# Patient Record
Sex: Male | Born: 1935
Health system: Southern US, Community
[De-identification: ages and names within clinical notes are randomized; demographics above are authoritative.]

## PROBLEM LIST (undated history)

## (undated) DIAGNOSIS — H3530 Unspecified macular degeneration: Secondary | ICD-10-CM

## (undated) DIAGNOSIS — H269 Unspecified cataract: Secondary | ICD-10-CM

## (undated) HISTORY — PX: EYE SURGERY: SHX253

## (undated) HISTORY — DX: Unspecified cataract: H26.9

## (undated) HISTORY — PX: CATARACT EXTRACTION: SUR2

## (undated) HISTORY — DX: Unspecified macular degeneration: H35.30

---

## 2015-09-23 DIAGNOSIS — J069 Acute upper respiratory infection, unspecified: Secondary | ICD-10-CM | POA: Diagnosis not present

## 2015-09-23 DIAGNOSIS — M1990 Unspecified osteoarthritis, unspecified site: Secondary | ICD-10-CM | POA: Diagnosis not present

## 2015-09-23 DIAGNOSIS — Z87891 Personal history of nicotine dependence: Secondary | ICD-10-CM | POA: Diagnosis not present

## 2015-09-23 DIAGNOSIS — I10 Essential (primary) hypertension: Secondary | ICD-10-CM | POA: Diagnosis not present

## 2015-10-14 DIAGNOSIS — Z6824 Body mass index (BMI) 24.0-24.9, adult: Secondary | ICD-10-CM | POA: Diagnosis not present

## 2015-10-14 DIAGNOSIS — Z1211 Encounter for screening for malignant neoplasm of colon: Secondary | ICD-10-CM | POA: Diagnosis not present

## 2015-10-14 DIAGNOSIS — Z1389 Encounter for screening for other disorder: Secondary | ICD-10-CM | POA: Diagnosis not present

## 2015-10-14 DIAGNOSIS — K219 Gastro-esophageal reflux disease without esophagitis: Secondary | ICD-10-CM | POA: Diagnosis not present

## 2015-10-14 DIAGNOSIS — Z299 Encounter for prophylactic measures, unspecified: Secondary | ICD-10-CM | POA: Diagnosis not present

## 2015-10-14 DIAGNOSIS — Z7189 Other specified counseling: Secondary | ICD-10-CM | POA: Diagnosis not present

## 2015-10-14 DIAGNOSIS — Z0000 Encounter for general adult medical examination without abnormal findings: Secondary | ICD-10-CM | POA: Diagnosis not present

## 2015-10-15 DIAGNOSIS — Z125 Encounter for screening for malignant neoplasm of prostate: Secondary | ICD-10-CM | POA: Diagnosis not present

## 2015-10-15 DIAGNOSIS — R5383 Other fatigue: Secondary | ICD-10-CM | POA: Diagnosis not present

## 2015-10-15 DIAGNOSIS — Z79899 Other long term (current) drug therapy: Secondary | ICD-10-CM | POA: Diagnosis not present

## 2016-01-14 DIAGNOSIS — M542 Cervicalgia: Secondary | ICD-10-CM | POA: Diagnosis not present

## 2016-01-14 DIAGNOSIS — M1990 Unspecified osteoarthritis, unspecified site: Secondary | ICD-10-CM | POA: Diagnosis not present

## 2016-01-14 DIAGNOSIS — I10 Essential (primary) hypertension: Secondary | ICD-10-CM | POA: Diagnosis not present

## 2016-01-26 DIAGNOSIS — I10 Essential (primary) hypertension: Secondary | ICD-10-CM | POA: Diagnosis not present

## 2016-01-26 DIAGNOSIS — N400 Enlarged prostate without lower urinary tract symptoms: Secondary | ICD-10-CM | POA: Diagnosis not present

## 2016-01-26 DIAGNOSIS — R1030 Lower abdominal pain, unspecified: Secondary | ICD-10-CM | POA: Diagnosis not present

## 2016-01-27 DIAGNOSIS — R1030 Lower abdominal pain, unspecified: Secondary | ICD-10-CM | POA: Diagnosis not present

## 2016-04-15 DIAGNOSIS — Z299 Encounter for prophylactic measures, unspecified: Secondary | ICD-10-CM | POA: Diagnosis not present

## 2016-04-15 DIAGNOSIS — M545 Low back pain: Secondary | ICD-10-CM | POA: Diagnosis not present

## 2016-04-15 DIAGNOSIS — N400 Enlarged prostate without lower urinary tract symptoms: Secondary | ICD-10-CM | POA: Diagnosis not present

## 2016-04-15 DIAGNOSIS — J069 Acute upper respiratory infection, unspecified: Secondary | ICD-10-CM | POA: Diagnosis not present

## 2016-04-15 DIAGNOSIS — I10 Essential (primary) hypertension: Secondary | ICD-10-CM | POA: Diagnosis not present

## 2016-05-27 DIAGNOSIS — N400 Enlarged prostate without lower urinary tract symptoms: Secondary | ICD-10-CM | POA: Diagnosis not present

## 2016-05-27 DIAGNOSIS — R1030 Lower abdominal pain, unspecified: Secondary | ICD-10-CM | POA: Diagnosis not present

## 2016-05-27 DIAGNOSIS — Z299 Encounter for prophylactic measures, unspecified: Secondary | ICD-10-CM | POA: Diagnosis not present

## 2016-05-27 DIAGNOSIS — I10 Essential (primary) hypertension: Secondary | ICD-10-CM | POA: Diagnosis not present

## 2016-05-27 DIAGNOSIS — N410 Acute prostatitis: Secondary | ICD-10-CM | POA: Diagnosis not present

## 2016-05-31 DIAGNOSIS — R1030 Lower abdominal pain, unspecified: Secondary | ICD-10-CM | POA: Diagnosis not present

## 2016-05-31 DIAGNOSIS — Z79899 Other long term (current) drug therapy: Secondary | ICD-10-CM | POA: Diagnosis not present

## 2016-06-01 DIAGNOSIS — R197 Diarrhea, unspecified: Secondary | ICD-10-CM | POA: Diagnosis not present

## 2016-06-01 DIAGNOSIS — R1032 Left lower quadrant pain: Secondary | ICD-10-CM | POA: Diagnosis not present

## 2016-06-01 DIAGNOSIS — K5900 Constipation, unspecified: Secondary | ICD-10-CM | POA: Diagnosis not present

## 2016-06-01 DIAGNOSIS — R935 Abnormal findings on diagnostic imaging of other abdominal regions, including retroperitoneum: Secondary | ICD-10-CM | POA: Diagnosis not present

## 2016-06-01 DIAGNOSIS — N400 Enlarged prostate without lower urinary tract symptoms: Secondary | ICD-10-CM | POA: Diagnosis not present

## 2016-06-16 DIAGNOSIS — Z299 Encounter for prophylactic measures, unspecified: Secondary | ICD-10-CM | POA: Diagnosis not present

## 2016-06-16 DIAGNOSIS — Z6824 Body mass index (BMI) 24.0-24.9, adult: Secondary | ICD-10-CM | POA: Diagnosis not present

## 2016-06-16 DIAGNOSIS — Z87891 Personal history of nicotine dependence: Secondary | ICD-10-CM | POA: Diagnosis not present

## 2016-06-16 DIAGNOSIS — E785 Hyperlipidemia, unspecified: Secondary | ICD-10-CM | POA: Diagnosis not present

## 2016-06-16 DIAGNOSIS — M1990 Unspecified osteoarthritis, unspecified site: Secondary | ICD-10-CM | POA: Diagnosis not present

## 2016-06-16 DIAGNOSIS — I10 Essential (primary) hypertension: Secondary | ICD-10-CM | POA: Diagnosis not present

## 2016-09-23 DIAGNOSIS — M159 Polyosteoarthritis, unspecified: Secondary | ICD-10-CM | POA: Diagnosis not present

## 2016-09-23 DIAGNOSIS — E7800 Pure hypercholesterolemia, unspecified: Secondary | ICD-10-CM | POA: Diagnosis not present

## 2016-09-23 DIAGNOSIS — I10 Essential (primary) hypertension: Secondary | ICD-10-CM | POA: Diagnosis not present

## 2016-10-19 DIAGNOSIS — R1319 Other dysphagia: Secondary | ICD-10-CM | POA: Diagnosis present

## 2016-10-19 DIAGNOSIS — S066X1A Traumatic subarachnoid hemorrhage with loss of consciousness of 30 minutes or less, initial encounter: Secondary | ICD-10-CM | POA: Diagnosis present

## 2016-10-19 DIAGNOSIS — R58 Hemorrhage, not elsewhere classified: Secondary | ICD-10-CM | POA: Diagnosis not present

## 2016-10-19 DIAGNOSIS — I129 Hypertensive chronic kidney disease with stage 1 through stage 4 chronic kidney disease, or unspecified chronic kidney disease: Secondary | ICD-10-CM | POA: Diagnosis present

## 2016-10-19 DIAGNOSIS — S42002A Fracture of unspecified part of left clavicle, initial encounter for closed fracture: Secondary | ICD-10-CM | POA: Diagnosis not present

## 2016-10-19 DIAGNOSIS — S0219XA Other fracture of base of skull, initial encounter for closed fracture: Secondary | ICD-10-CM | POA: Diagnosis not present

## 2016-10-19 DIAGNOSIS — S065X9A Traumatic subdural hemorrhage with loss of consciousness of unspecified duration, initial encounter: Secondary | ICD-10-CM | POA: Diagnosis not present

## 2016-10-19 DIAGNOSIS — R3914 Feeling of incomplete bladder emptying: Secondary | ICD-10-CM | POA: Diagnosis not present

## 2016-10-19 DIAGNOSIS — S065X9D Traumatic subdural hemorrhage with loss of consciousness of unspecified duration, subsequent encounter: Secondary | ICD-10-CM | POA: Diagnosis not present

## 2016-10-19 DIAGNOSIS — M1611 Unilateral primary osteoarthritis, right hip: Secondary | ICD-10-CM | POA: Diagnosis not present

## 2016-10-19 DIAGNOSIS — G936 Cerebral edema: Secondary | ICD-10-CM | POA: Diagnosis not present

## 2016-10-19 DIAGNOSIS — K219 Gastro-esophageal reflux disease without esophagitis: Secondary | ICD-10-CM | POA: Diagnosis not present

## 2016-10-19 DIAGNOSIS — I6200 Nontraumatic subdural hemorrhage, unspecified: Secondary | ICD-10-CM | POA: Diagnosis not present

## 2016-10-19 DIAGNOSIS — R9089 Other abnormal findings on diagnostic imaging of central nervous system: Secondary | ICD-10-CM | POA: Diagnosis not present

## 2016-10-19 DIAGNOSIS — R000 Tachycardia, unspecified: Secondary | ICD-10-CM | POA: Diagnosis not present

## 2016-10-19 DIAGNOSIS — F0391 Unspecified dementia with behavioral disturbance: Secondary | ICD-10-CM | POA: Diagnosis present

## 2016-10-19 DIAGNOSIS — R410 Disorientation, unspecified: Secondary | ICD-10-CM | POA: Diagnosis not present

## 2016-10-19 DIAGNOSIS — M9931 Osseous stenosis of neural canal of cervical region: Secondary | ICD-10-CM | POA: Diagnosis not present

## 2016-10-19 DIAGNOSIS — G4701 Insomnia due to medical condition: Secondary | ICD-10-CM | POA: Diagnosis not present

## 2016-10-19 DIAGNOSIS — S060X9A Concussion with loss of consciousness of unspecified duration, initial encounter: Secondary | ICD-10-CM | POA: Diagnosis not present

## 2016-10-19 DIAGNOSIS — E876 Hypokalemia: Secondary | ICD-10-CM | POA: Diagnosis not present

## 2016-10-19 DIAGNOSIS — R062 Wheezing: Secondary | ICD-10-CM | POA: Diagnosis not present

## 2016-10-19 DIAGNOSIS — E538 Deficiency of other specified B group vitamins: Secondary | ICD-10-CM | POA: Diagnosis not present

## 2016-10-19 DIAGNOSIS — W19XXXD Unspecified fall, subsequent encounter: Secondary | ICD-10-CM | POA: Diagnosis not present

## 2016-10-19 DIAGNOSIS — Z931 Gastrostomy status: Secondary | ICD-10-CM | POA: Diagnosis not present

## 2016-10-19 DIAGNOSIS — M47812 Spondylosis without myelopathy or radiculopathy, cervical region: Secondary | ICD-10-CM | POA: Diagnosis not present

## 2016-10-19 DIAGNOSIS — S3210XA Unspecified fracture of sacrum, initial encounter for closed fracture: Secondary | ICD-10-CM | POA: Diagnosis not present

## 2016-10-19 DIAGNOSIS — R109 Unspecified abdominal pain: Secondary | ICD-10-CM | POA: Diagnosis not present

## 2016-10-19 DIAGNOSIS — R339 Retention of urine, unspecified: Secondary | ICD-10-CM | POA: Diagnosis not present

## 2016-10-19 DIAGNOSIS — E870 Hyperosmolality and hypernatremia: Secondary | ICD-10-CM | POA: Diagnosis not present

## 2016-10-19 DIAGNOSIS — S02119A Unspecified fracture of occiput, initial encounter for closed fracture: Secondary | ICD-10-CM | POA: Diagnosis not present

## 2016-10-19 DIAGNOSIS — I10 Essential (primary) hypertension: Secondary | ICD-10-CM | POA: Diagnosis not present

## 2016-10-19 DIAGNOSIS — R269 Unspecified abnormalities of gait and mobility: Secondary | ICD-10-CM | POA: Diagnosis not present

## 2016-10-19 DIAGNOSIS — S098XXA Other specified injuries of head, initial encounter: Secondary | ICD-10-CM | POA: Diagnosis not present

## 2016-10-19 DIAGNOSIS — S020XXA Fracture of vault of skull, initial encounter for closed fracture: Secondary | ICD-10-CM | POA: Diagnosis not present

## 2016-10-19 DIAGNOSIS — S161XXA Strain of muscle, fascia and tendon at neck level, initial encounter: Secondary | ICD-10-CM | POA: Diagnosis present

## 2016-10-19 DIAGNOSIS — S22069D Unspecified fracture of T7-T8 vertebra, subsequent encounter for fracture with routine healing: Secondary | ICD-10-CM | POA: Diagnosis not present

## 2016-10-19 DIAGNOSIS — N400 Enlarged prostate without lower urinary tract symptoms: Secondary | ICD-10-CM | POA: Diagnosis not present

## 2016-10-19 DIAGNOSIS — I609 Nontraumatic subarachnoid hemorrhage, unspecified: Secondary | ICD-10-CM | POA: Diagnosis not present

## 2016-10-19 DIAGNOSIS — D62 Acute posthemorrhagic anemia: Secondary | ICD-10-CM | POA: Diagnosis present

## 2016-10-19 DIAGNOSIS — S299XXA Unspecified injury of thorax, initial encounter: Secondary | ICD-10-CM | POA: Diagnosis not present

## 2016-10-19 DIAGNOSIS — S0093XA Contusion of unspecified part of head, initial encounter: Secondary | ICD-10-CM | POA: Diagnosis not present

## 2016-10-19 DIAGNOSIS — S065X0A Traumatic subdural hemorrhage without loss of consciousness, initial encounter: Secondary | ICD-10-CM | POA: Diagnosis not present

## 2016-10-19 DIAGNOSIS — N183 Chronic kidney disease, stage 3 (moderate): Secondary | ICD-10-CM | POA: Diagnosis present

## 2016-10-19 DIAGNOSIS — M7122 Synovial cyst of popliteal space [Baker], left knee: Secondary | ICD-10-CM | POA: Diagnosis not present

## 2016-10-19 DIAGNOSIS — R079 Chest pain, unspecified: Secondary | ICD-10-CM | POA: Diagnosis not present

## 2016-10-19 DIAGNOSIS — S22079A Unspecified fracture of T9-T10 vertebra, initial encounter for closed fracture: Secondary | ICD-10-CM | POA: Diagnosis present

## 2016-10-19 DIAGNOSIS — S0003XA Contusion of scalp, initial encounter: Secondary | ICD-10-CM | POA: Diagnosis not present

## 2016-10-19 DIAGNOSIS — M542 Cervicalgia: Secondary | ICD-10-CM | POA: Diagnosis not present

## 2016-10-19 DIAGNOSIS — S065X1A Traumatic subdural hemorrhage with loss of consciousness of 30 minutes or less, initial encounter: Secondary | ICD-10-CM | POA: Diagnosis present

## 2016-10-19 DIAGNOSIS — W109XXA Fall (on) (from) unspecified stairs and steps, initial encounter: Secondary | ICD-10-CM | POA: Diagnosis not present

## 2016-10-19 DIAGNOSIS — S0291XA Unspecified fracture of skull, initial encounter for closed fracture: Secondary | ICD-10-CM | POA: Diagnosis not present

## 2016-10-19 DIAGNOSIS — I672 Cerebral atherosclerosis: Secondary | ICD-10-CM | POA: Diagnosis not present

## 2016-10-19 DIAGNOSIS — S0093XD Contusion of unspecified part of head, subsequent encounter: Secondary | ICD-10-CM | POA: Diagnosis not present

## 2016-10-19 DIAGNOSIS — Z8673 Personal history of transient ischemic attack (TIA), and cerebral infarction without residual deficits: Secondary | ICD-10-CM | POA: Diagnosis not present

## 2016-10-19 DIAGNOSIS — S32120A Nondisplaced Zone II fracture of sacrum, initial encounter for closed fracture: Secondary | ICD-10-CM | POA: Diagnosis not present

## 2016-10-19 DIAGNOSIS — E559 Vitamin D deficiency, unspecified: Secondary | ICD-10-CM | POA: Diagnosis not present

## 2016-10-19 DIAGNOSIS — S06360A Traumatic hemorrhage of cerebrum, unspecified, without loss of consciousness, initial encounter: Secondary | ICD-10-CM | POA: Diagnosis not present

## 2016-10-19 DIAGNOSIS — J690 Pneumonitis due to inhalation of food and vomit: Secondary | ICD-10-CM | POA: Diagnosis not present

## 2016-10-19 DIAGNOSIS — F05 Delirium due to known physiological condition: Secondary | ICD-10-CM | POA: Diagnosis present

## 2016-10-19 DIAGNOSIS — E871 Hypo-osmolality and hyponatremia: Secondary | ICD-10-CM | POA: Diagnosis present

## 2016-10-19 DIAGNOSIS — S3993XA Unspecified injury of pelvis, initial encounter: Secondary | ICD-10-CM | POA: Diagnosis not present

## 2016-10-19 DIAGNOSIS — R1310 Dysphagia, unspecified: Secondary | ICD-10-CM | POA: Diagnosis not present

## 2016-10-19 DIAGNOSIS — S3992XA Unspecified injury of lower back, initial encounter: Secondary | ICD-10-CM | POA: Diagnosis not present

## 2016-10-19 DIAGNOSIS — S066X0A Traumatic subarachnoid hemorrhage without loss of consciousness, initial encounter: Secondary | ICD-10-CM | POA: Diagnosis not present

## 2016-10-19 DIAGNOSIS — Z2821 Immunization not carried out because of patient refusal: Secondary | ICD-10-CM | POA: Diagnosis not present

## 2016-10-19 DIAGNOSIS — R609 Edema, unspecified: Secondary | ICD-10-CM | POA: Diagnosis not present

## 2016-10-19 DIAGNOSIS — S22068A Other fracture of T7-T8 thoracic vertebra, initial encounter for closed fracture: Secondary | ICD-10-CM | POA: Diagnosis not present

## 2016-10-19 DIAGNOSIS — H9193 Unspecified hearing loss, bilateral: Secondary | ICD-10-CM | POA: Diagnosis present

## 2016-10-19 DIAGNOSIS — Z743 Need for continuous supervision: Secondary | ICD-10-CM | POA: Diagnosis not present

## 2016-10-19 DIAGNOSIS — S199XXA Unspecified injury of neck, initial encounter: Secondary | ICD-10-CM | POA: Diagnosis not present

## 2016-10-19 DIAGNOSIS — S3991XA Unspecified injury of abdomen, initial encounter: Secondary | ICD-10-CM | POA: Diagnosis not present

## 2016-10-19 DIAGNOSIS — W19XXXA Unspecified fall, initial encounter: Secondary | ICD-10-CM | POA: Diagnosis not present

## 2016-10-19 DIAGNOSIS — I2510 Atherosclerotic heart disease of native coronary artery without angina pectoris: Secondary | ICD-10-CM | POA: Diagnosis present

## 2016-10-19 DIAGNOSIS — S060X1A Concussion with loss of consciousness of 30 minutes or less, initial encounter: Secondary | ICD-10-CM | POA: Diagnosis not present

## 2016-10-19 DIAGNOSIS — I959 Hypotension, unspecified: Secondary | ICD-10-CM | POA: Diagnosis not present

## 2016-10-19 DIAGNOSIS — S3219XA Other fracture of sacrum, initial encounter for closed fracture: Secondary | ICD-10-CM | POA: Diagnosis not present

## 2016-10-19 DIAGNOSIS — S22069A Unspecified fracture of T7-T8 vertebra, initial encounter for closed fracture: Secondary | ICD-10-CM | POA: Diagnosis not present

## 2016-10-19 DIAGNOSIS — D649 Anemia, unspecified: Secondary | ICD-10-CM | POA: Diagnosis not present

## 2016-10-19 DIAGNOSIS — S0990XA Unspecified injury of head, initial encounter: Secondary | ICD-10-CM | POA: Diagnosis not present

## 2016-10-19 DIAGNOSIS — Z7189 Other specified counseling: Secondary | ICD-10-CM | POA: Diagnosis not present

## 2016-10-19 DIAGNOSIS — K661 Hemoperitoneum: Secondary | ICD-10-CM | POA: Diagnosis present

## 2016-10-19 DIAGNOSIS — S22078A Other fracture of T9-T10 vertebra, initial encounter for closed fracture: Secondary | ICD-10-CM | POA: Diagnosis not present

## 2016-10-19 DIAGNOSIS — Z9181 History of falling: Secondary | ICD-10-CM | POA: Diagnosis not present

## 2016-10-19 DIAGNOSIS — N401 Enlarged prostate with lower urinary tract symptoms: Secondary | ICD-10-CM | POA: Diagnosis not present

## 2016-10-19 DIAGNOSIS — W108XXA Fall (on) (from) other stairs and steps, initial encounter: Secondary | ICD-10-CM | POA: Diagnosis not present

## 2016-10-19 DIAGNOSIS — S0101XA Laceration without foreign body of scalp, initial encounter: Secondary | ICD-10-CM | POA: Diagnosis present

## 2016-10-19 DIAGNOSIS — R918 Other nonspecific abnormal finding of lung field: Secondary | ICD-10-CM | POA: Diagnosis not present

## 2016-10-19 DIAGNOSIS — S0211HA Other fracture of occiput, left side, initial encounter for closed fracture: Secondary | ICD-10-CM | POA: Diagnosis present

## 2016-10-19 DIAGNOSIS — J984 Other disorders of lung: Secondary | ICD-10-CM | POA: Diagnosis not present

## 2016-11-02 DIAGNOSIS — Z8249 Family history of ischemic heart disease and other diseases of the circulatory system: Secondary | ICD-10-CM | POA: Diagnosis not present

## 2016-11-02 DIAGNOSIS — S066X9D Traumatic subarachnoid hemorrhage with loss of consciousness of unspecified duration, subsequent encounter: Secondary | ICD-10-CM | POA: Diagnosis not present

## 2016-11-02 DIAGNOSIS — S066X0A Traumatic subarachnoid hemorrhage without loss of consciousness, initial encounter: Secondary | ICD-10-CM | POA: Diagnosis not present

## 2016-11-02 DIAGNOSIS — D500 Iron deficiency anemia secondary to blood loss (chronic): Secondary | ICD-10-CM | POA: Diagnosis present

## 2016-11-02 DIAGNOSIS — Z888 Allergy status to other drugs, medicaments and biological substances status: Secondary | ICD-10-CM | POA: Diagnosis not present

## 2016-11-02 DIAGNOSIS — S22069D Unspecified fracture of T7-T8 vertebra, subsequent encounter for fracture with routine healing: Secondary | ICD-10-CM | POA: Diagnosis not present

## 2016-11-02 DIAGNOSIS — S0990XA Unspecified injury of head, initial encounter: Secondary | ICD-10-CM | POA: Diagnosis not present

## 2016-11-02 DIAGNOSIS — S3210XD Unspecified fracture of sacrum, subsequent encounter for fracture with routine healing: Secondary | ICD-10-CM | POA: Diagnosis not present

## 2016-11-02 DIAGNOSIS — G936 Cerebral edema: Secondary | ICD-10-CM | POA: Diagnosis not present

## 2016-11-02 DIAGNOSIS — E7800 Pure hypercholesterolemia, unspecified: Secondary | ICD-10-CM | POA: Diagnosis present

## 2016-11-02 DIAGNOSIS — R0902 Hypoxemia: Secondary | ICD-10-CM | POA: Diagnosis present

## 2016-11-02 DIAGNOSIS — S065X0A Traumatic subdural hemorrhage without loss of consciousness, initial encounter: Secondary | ICD-10-CM | POA: Diagnosis not present

## 2016-11-02 DIAGNOSIS — S0291XA Unspecified fracture of skull, initial encounter for closed fracture: Secondary | ICD-10-CM | POA: Diagnosis not present

## 2016-11-02 DIAGNOSIS — J690 Pneumonitis due to inhalation of food and vomit: Secondary | ICD-10-CM | POA: Diagnosis present

## 2016-11-02 DIAGNOSIS — R0602 Shortness of breath: Secondary | ICD-10-CM | POA: Diagnosis not present

## 2016-11-02 DIAGNOSIS — S065X9D Traumatic subdural hemorrhage with loss of consciousness of unspecified duration, subsequent encounter: Secondary | ICD-10-CM | POA: Diagnosis not present

## 2016-11-02 DIAGNOSIS — R404 Transient alteration of awareness: Secondary | ICD-10-CM | POA: Diagnosis not present

## 2016-11-02 DIAGNOSIS — N400 Enlarged prostate without lower urinary tract symptoms: Secondary | ICD-10-CM | POA: Diagnosis present

## 2016-11-02 DIAGNOSIS — S061X9D Traumatic cerebral edema with loss of consciousness of unspecified duration, subsequent encounter: Secondary | ICD-10-CM | POA: Diagnosis not present

## 2016-11-02 DIAGNOSIS — R0603 Acute respiratory distress: Secondary | ICD-10-CM | POA: Diagnosis present

## 2016-11-02 DIAGNOSIS — S22079D Unspecified fracture of T9-T10 vertebra, subsequent encounter for fracture with routine healing: Secondary | ICD-10-CM | POA: Diagnosis not present

## 2016-11-02 DIAGNOSIS — R0682 Tachypnea, not elsewhere classified: Secondary | ICD-10-CM | POA: Diagnosis not present

## 2016-11-02 DIAGNOSIS — Z87891 Personal history of nicotine dependence: Secondary | ICD-10-CM | POA: Diagnosis not present

## 2016-11-02 DIAGNOSIS — K219 Gastro-esophageal reflux disease without esophagitis: Secondary | ICD-10-CM | POA: Diagnosis present

## 2016-11-02 DIAGNOSIS — W19XXXD Unspecified fall, subsequent encounter: Secondary | ICD-10-CM | POA: Diagnosis not present

## 2016-11-02 DIAGNOSIS — Z79899 Other long term (current) drug therapy: Secondary | ICD-10-CM | POA: Diagnosis not present

## 2016-11-02 DIAGNOSIS — D649 Anemia, unspecified: Secondary | ICD-10-CM | POA: Diagnosis not present

## 2016-11-02 DIAGNOSIS — Z931 Gastrostomy status: Secondary | ICD-10-CM | POA: Diagnosis not present

## 2016-11-02 DIAGNOSIS — S3210XA Unspecified fracture of sacrum, initial encounter for closed fracture: Secondary | ICD-10-CM | POA: Diagnosis not present

## 2016-11-02 DIAGNOSIS — I10 Essential (primary) hypertension: Secondary | ICD-10-CM | POA: Diagnosis present

## 2016-11-02 DIAGNOSIS — S065X9A Traumatic subdural hemorrhage with loss of consciousness of unspecified duration, initial encounter: Secondary | ICD-10-CM | POA: Diagnosis not present

## 2016-11-02 DIAGNOSIS — Z9181 History of falling: Secondary | ICD-10-CM | POA: Diagnosis not present

## 2016-11-02 DIAGNOSIS — Z886 Allergy status to analgesic agent status: Secondary | ICD-10-CM | POA: Diagnosis not present

## 2016-11-03 DIAGNOSIS — Z8249 Family history of ischemic heart disease and other diseases of the circulatory system: Secondary | ICD-10-CM | POA: Diagnosis not present

## 2016-11-03 DIAGNOSIS — R0603 Acute respiratory distress: Secondary | ICD-10-CM | POA: Diagnosis not present

## 2016-11-03 DIAGNOSIS — S3210XA Unspecified fracture of sacrum, initial encounter for closed fracture: Secondary | ICD-10-CM | POA: Diagnosis not present

## 2016-11-03 DIAGNOSIS — E7800 Pure hypercholesterolemia, unspecified: Secondary | ICD-10-CM | POA: Diagnosis present

## 2016-11-03 DIAGNOSIS — R0602 Shortness of breath: Secondary | ICD-10-CM | POA: Diagnosis not present

## 2016-11-03 DIAGNOSIS — K219 Gastro-esophageal reflux disease without esophagitis: Secondary | ICD-10-CM | POA: Diagnosis present

## 2016-11-03 DIAGNOSIS — S066X0A Traumatic subarachnoid hemorrhage without loss of consciousness, initial encounter: Secondary | ICD-10-CM | POA: Diagnosis not present

## 2016-11-03 DIAGNOSIS — R0682 Tachypnea, not elsewhere classified: Secondary | ICD-10-CM | POA: Diagnosis not present

## 2016-11-03 DIAGNOSIS — S22079D Unspecified fracture of T9-T10 vertebra, subsequent encounter for fracture with routine healing: Secondary | ICD-10-CM | POA: Diagnosis not present

## 2016-11-03 DIAGNOSIS — J90 Pleural effusion, not elsewhere classified: Secondary | ICD-10-CM | POA: Diagnosis not present

## 2016-11-03 DIAGNOSIS — Z79899 Other long term (current) drug therapy: Secondary | ICD-10-CM | POA: Diagnosis not present

## 2016-11-03 DIAGNOSIS — S3210XD Unspecified fracture of sacrum, subsequent encounter for fracture with routine healing: Secondary | ICD-10-CM | POA: Diagnosis not present

## 2016-11-03 DIAGNOSIS — R2689 Other abnormalities of gait and mobility: Secondary | ICD-10-CM | POA: Diagnosis not present

## 2016-11-03 DIAGNOSIS — N400 Enlarged prostate without lower urinary tract symptoms: Secondary | ICD-10-CM | POA: Diagnosis present

## 2016-11-03 DIAGNOSIS — Z9181 History of falling: Secondary | ICD-10-CM | POA: Diagnosis not present

## 2016-11-03 DIAGNOSIS — I10 Essential (primary) hypertension: Secondary | ICD-10-CM | POA: Diagnosis present

## 2016-11-03 DIAGNOSIS — W19XXXD Unspecified fall, subsequent encounter: Secondary | ICD-10-CM | POA: Diagnosis not present

## 2016-11-03 DIAGNOSIS — D649 Anemia, unspecified: Secondary | ICD-10-CM | POA: Diagnosis not present

## 2016-11-03 DIAGNOSIS — S22009D Unspecified fracture of unspecified thoracic vertebra, subsequent encounter for fracture with routine healing: Secondary | ICD-10-CM | POA: Diagnosis not present

## 2016-11-03 DIAGNOSIS — G936 Cerebral edema: Secondary | ICD-10-CM | POA: Diagnosis not present

## 2016-11-03 DIAGNOSIS — S066X9D Traumatic subarachnoid hemorrhage with loss of consciousness of unspecified duration, subsequent encounter: Secondary | ICD-10-CM | POA: Diagnosis not present

## 2016-11-03 DIAGNOSIS — R0902 Hypoxemia: Secondary | ICD-10-CM | POA: Diagnosis present

## 2016-11-03 DIAGNOSIS — S065X9D Traumatic subdural hemorrhage with loss of consciousness of unspecified duration, subsequent encounter: Secondary | ICD-10-CM | POA: Diagnosis not present

## 2016-11-03 DIAGNOSIS — Z87891 Personal history of nicotine dependence: Secondary | ICD-10-CM | POA: Diagnosis not present

## 2016-11-03 DIAGNOSIS — S22069D Unspecified fracture of T7-T8 vertebra, subsequent encounter for fracture with routine healing: Secondary | ICD-10-CM | POA: Diagnosis not present

## 2016-11-03 DIAGNOSIS — Z931 Gastrostomy status: Secondary | ICD-10-CM | POA: Diagnosis not present

## 2016-11-03 DIAGNOSIS — Z888 Allergy status to other drugs, medicaments and biological substances status: Secondary | ICD-10-CM | POA: Diagnosis not present

## 2016-11-03 DIAGNOSIS — R404 Transient alteration of awareness: Secondary | ICD-10-CM | POA: Diagnosis not present

## 2016-11-03 DIAGNOSIS — S065X0A Traumatic subdural hemorrhage without loss of consciousness, initial encounter: Secondary | ICD-10-CM | POA: Diagnosis not present

## 2016-11-03 DIAGNOSIS — M6281 Muscle weakness (generalized): Secondary | ICD-10-CM | POA: Diagnosis not present

## 2016-11-03 DIAGNOSIS — D500 Iron deficiency anemia secondary to blood loss (chronic): Secondary | ICD-10-CM | POA: Diagnosis not present

## 2016-11-03 DIAGNOSIS — S0990XD Unspecified injury of head, subsequent encounter: Secondary | ICD-10-CM | POA: Diagnosis not present

## 2016-11-03 DIAGNOSIS — Z886 Allergy status to analgesic agent status: Secondary | ICD-10-CM | POA: Diagnosis not present

## 2016-11-03 DIAGNOSIS — S061X9D Traumatic cerebral edema with loss of consciousness of unspecified duration, subsequent encounter: Secondary | ICD-10-CM | POA: Diagnosis not present

## 2016-11-03 DIAGNOSIS — J302 Other seasonal allergic rhinitis: Secondary | ICD-10-CM | POA: Diagnosis not present

## 2016-11-03 DIAGNOSIS — J690 Pneumonitis due to inhalation of food and vomit: Secondary | ICD-10-CM | POA: Diagnosis not present

## 2016-11-05 DIAGNOSIS — R0902 Hypoxemia: Secondary | ICD-10-CM | POA: Diagnosis not present

## 2016-11-05 DIAGNOSIS — S065X9D Traumatic subdural hemorrhage with loss of consciousness of unspecified duration, subsequent encounter: Secondary | ICD-10-CM | POA: Diagnosis not present

## 2016-11-05 DIAGNOSIS — R1312 Dysphagia, oropharyngeal phase: Secondary | ICD-10-CM | POA: Diagnosis not present

## 2016-11-05 DIAGNOSIS — Z9181 History of falling: Secondary | ICD-10-CM | POA: Diagnosis not present

## 2016-11-05 DIAGNOSIS — R1310 Dysphagia, unspecified: Secondary | ICD-10-CM | POA: Diagnosis not present

## 2016-11-05 DIAGNOSIS — J690 Pneumonitis due to inhalation of food and vomit: Secondary | ICD-10-CM | POA: Diagnosis not present

## 2016-11-05 DIAGNOSIS — M6281 Muscle weakness (generalized): Secondary | ICD-10-CM | POA: Diagnosis not present

## 2016-11-05 DIAGNOSIS — M4854XA Collapsed vertebra, not elsewhere classified, thoracic region, initial encounter for fracture: Secondary | ICD-10-CM | POA: Diagnosis not present

## 2016-11-05 DIAGNOSIS — S0990XD Unspecified injury of head, subsequent encounter: Secondary | ICD-10-CM | POA: Diagnosis not present

## 2016-11-05 DIAGNOSIS — S134XXA Sprain of ligaments of cervical spine, initial encounter: Secondary | ICD-10-CM | POA: Diagnosis not present

## 2016-11-05 DIAGNOSIS — N400 Enlarged prostate without lower urinary tract symptoms: Secondary | ICD-10-CM | POA: Diagnosis not present

## 2016-11-05 DIAGNOSIS — S139XXA Sprain of joints and ligaments of unspecified parts of neck, initial encounter: Secondary | ICD-10-CM | POA: Diagnosis not present

## 2016-11-05 DIAGNOSIS — Z888 Allergy status to other drugs, medicaments and biological substances status: Secondary | ICD-10-CM | POA: Diagnosis not present

## 2016-11-05 DIAGNOSIS — D649 Anemia, unspecified: Secondary | ICD-10-CM | POA: Diagnosis not present

## 2016-11-05 DIAGNOSIS — I10 Essential (primary) hypertension: Secondary | ICD-10-CM | POA: Diagnosis not present

## 2016-11-05 DIAGNOSIS — S065X0A Traumatic subdural hemorrhage without loss of consciousness, initial encounter: Secondary | ICD-10-CM | POA: Diagnosis not present

## 2016-11-05 DIAGNOSIS — J90 Pleural effusion, not elsewhere classified: Secondary | ICD-10-CM | POA: Diagnosis not present

## 2016-11-05 DIAGNOSIS — T1490XA Injury, unspecified, initial encounter: Secondary | ICD-10-CM | POA: Diagnosis not present

## 2016-11-05 DIAGNOSIS — K219 Gastro-esophageal reflux disease without esophagitis: Secondary | ICD-10-CM | POA: Diagnosis not present

## 2016-11-05 DIAGNOSIS — S129XXA Fracture of neck, unspecified, initial encounter: Secondary | ICD-10-CM | POA: Diagnosis not present

## 2016-11-05 DIAGNOSIS — R0603 Acute respiratory distress: Secondary | ICD-10-CM | POA: Diagnosis not present

## 2016-11-05 DIAGNOSIS — J302 Other seasonal allergic rhinitis: Secondary | ICD-10-CM | POA: Diagnosis not present

## 2016-11-05 DIAGNOSIS — J9601 Acute respiratory failure with hypoxia: Secondary | ICD-10-CM | POA: Diagnosis not present

## 2016-11-05 DIAGNOSIS — W19XXXD Unspecified fall, subsequent encounter: Secondary | ICD-10-CM | POA: Diagnosis not present

## 2016-11-05 DIAGNOSIS — S22009D Unspecified fracture of unspecified thoracic vertebra, subsequent encounter for fracture with routine healing: Secondary | ICD-10-CM | POA: Diagnosis not present

## 2016-11-05 DIAGNOSIS — Z886 Allergy status to analgesic agent status: Secondary | ICD-10-CM | POA: Diagnosis not present

## 2016-11-05 DIAGNOSIS — S199XXA Unspecified injury of neck, initial encounter: Secondary | ICD-10-CM | POA: Diagnosis not present

## 2016-11-05 DIAGNOSIS — M1990 Unspecified osteoarthritis, unspecified site: Secondary | ICD-10-CM | POA: Diagnosis not present

## 2016-11-05 DIAGNOSIS — T17200A Unspecified foreign body in pharynx causing asphyxiation, initial encounter: Secondary | ICD-10-CM | POA: Diagnosis not present

## 2016-11-05 DIAGNOSIS — S066X9D Traumatic subarachnoid hemorrhage with loss of consciousness of unspecified duration, subsequent encounter: Secondary | ICD-10-CM | POA: Diagnosis not present

## 2016-11-05 DIAGNOSIS — M47812 Spondylosis without myelopathy or radiculopathy, cervical region: Secondary | ICD-10-CM | POA: Diagnosis not present

## 2016-11-05 DIAGNOSIS — R2689 Other abnormalities of gait and mobility: Secondary | ICD-10-CM | POA: Diagnosis not present

## 2016-11-05 DIAGNOSIS — S0990XA Unspecified injury of head, initial encounter: Secondary | ICD-10-CM | POA: Diagnosis not present

## 2016-11-05 DIAGNOSIS — Z431 Encounter for attention to gastrostomy: Secondary | ICD-10-CM | POA: Diagnosis not present

## 2016-11-06 DIAGNOSIS — S0990XA Unspecified injury of head, initial encounter: Secondary | ICD-10-CM | POA: Diagnosis not present

## 2016-11-06 DIAGNOSIS — J690 Pneumonitis due to inhalation of food and vomit: Secondary | ICD-10-CM | POA: Diagnosis not present

## 2016-11-06 DIAGNOSIS — S065X0A Traumatic subdural hemorrhage without loss of consciousness, initial encounter: Secondary | ICD-10-CM | POA: Diagnosis not present

## 2016-11-06 DIAGNOSIS — J9601 Acute respiratory failure with hypoxia: Secondary | ICD-10-CM | POA: Diagnosis not present

## 2016-11-27 DIAGNOSIS — R1310 Dysphagia, unspecified: Secondary | ICD-10-CM | POA: Diagnosis not present

## 2016-11-27 DIAGNOSIS — S199XXA Unspecified injury of neck, initial encounter: Secondary | ICD-10-CM | POA: Diagnosis not present

## 2016-11-27 DIAGNOSIS — Z9181 History of falling: Secondary | ICD-10-CM | POA: Diagnosis not present

## 2016-11-27 DIAGNOSIS — J690 Pneumonitis due to inhalation of food and vomit: Secondary | ICD-10-CM | POA: Diagnosis not present

## 2016-11-29 DIAGNOSIS — R1312 Dysphagia, oropharyngeal phase: Secondary | ICD-10-CM | POA: Diagnosis not present

## 2016-12-17 DIAGNOSIS — S134XXA Sprain of ligaments of cervical spine, initial encounter: Secondary | ICD-10-CM | POA: Diagnosis not present

## 2016-12-17 DIAGNOSIS — M4854XA Collapsed vertebra, not elsewhere classified, thoracic region, initial encounter for fracture: Secondary | ICD-10-CM | POA: Diagnosis not present

## 2016-12-17 DIAGNOSIS — M47812 Spondylosis without myelopathy or radiculopathy, cervical region: Secondary | ICD-10-CM | POA: Diagnosis not present

## 2016-12-17 DIAGNOSIS — S139XXA Sprain of joints and ligaments of unspecified parts of neck, initial encounter: Secondary | ICD-10-CM | POA: Diagnosis not present

## 2016-12-21 DIAGNOSIS — S129XXA Fracture of neck, unspecified, initial encounter: Secondary | ICD-10-CM | POA: Diagnosis not present

## 2016-12-21 DIAGNOSIS — Z431 Encounter for attention to gastrostomy: Secondary | ICD-10-CM | POA: Diagnosis not present

## 2016-12-21 DIAGNOSIS — T1490XA Injury, unspecified, initial encounter: Secondary | ICD-10-CM | POA: Diagnosis not present

## 2016-12-24 DIAGNOSIS — R1312 Dysphagia, oropharyngeal phase: Secondary | ICD-10-CM | POA: Diagnosis not present

## 2017-01-17 DIAGNOSIS — S129XXA Fracture of neck, unspecified, initial encounter: Secondary | ICD-10-CM | POA: Diagnosis not present

## 2017-01-17 DIAGNOSIS — M1990 Unspecified osteoarthritis, unspecified site: Secondary | ICD-10-CM | POA: Diagnosis not present

## 2017-01-17 DIAGNOSIS — R0902 Hypoxemia: Secondary | ICD-10-CM | POA: Diagnosis not present

## 2017-01-17 DIAGNOSIS — T17200A Unspecified foreign body in pharynx causing asphyxiation, initial encounter: Secondary | ICD-10-CM | POA: Diagnosis not present

## 2017-01-18 DIAGNOSIS — Z9181 History of falling: Secondary | ICD-10-CM | POA: Diagnosis not present

## 2017-01-18 DIAGNOSIS — S22060D Wedge compression fracture of T7-T8 vertebra, subsequent encounter for fracture with routine healing: Secondary | ICD-10-CM | POA: Diagnosis not present

## 2017-01-18 DIAGNOSIS — N400 Enlarged prostate without lower urinary tract symptoms: Secondary | ICD-10-CM | POA: Diagnosis not present

## 2017-01-18 DIAGNOSIS — R1310 Dysphagia, unspecified: Secondary | ICD-10-CM | POA: Diagnosis not present

## 2017-01-18 DIAGNOSIS — J690 Pneumonitis due to inhalation of food and vomit: Secondary | ICD-10-CM | POA: Diagnosis not present

## 2017-01-18 DIAGNOSIS — S129XXD Fracture of neck, unspecified, subsequent encounter: Secondary | ICD-10-CM | POA: Diagnosis not present

## 2017-01-18 DIAGNOSIS — S066X9D Traumatic subarachnoid hemorrhage with loss of consciousness of unspecified duration, subsequent encounter: Secondary | ICD-10-CM | POA: Diagnosis not present

## 2017-01-18 DIAGNOSIS — S065X9D Traumatic subdural hemorrhage with loss of consciousness of unspecified duration, subsequent encounter: Secondary | ICD-10-CM | POA: Diagnosis not present

## 2017-01-18 DIAGNOSIS — Z931 Gastrostomy status: Secondary | ICD-10-CM | POA: Diagnosis not present

## 2017-01-18 DIAGNOSIS — K219 Gastro-esophageal reflux disease without esophagitis: Secondary | ICD-10-CM | POA: Diagnosis not present

## 2017-01-18 DIAGNOSIS — S3210XD Unspecified fracture of sacrum, subsequent encounter for fracture with routine healing: Secondary | ICD-10-CM | POA: Diagnosis not present

## 2017-01-18 DIAGNOSIS — S22070D Wedge compression fracture of T9-T10 vertebra, subsequent encounter for fracture with routine healing: Secondary | ICD-10-CM | POA: Diagnosis not present

## 2017-01-18 DIAGNOSIS — I10 Essential (primary) hypertension: Secondary | ICD-10-CM | POA: Diagnosis not present

## 2017-01-19 DIAGNOSIS — S129XXD Fracture of neck, unspecified, subsequent encounter: Secondary | ICD-10-CM | POA: Diagnosis not present

## 2017-01-19 DIAGNOSIS — S3210XD Unspecified fracture of sacrum, subsequent encounter for fracture with routine healing: Secondary | ICD-10-CM | POA: Diagnosis not present

## 2017-01-19 DIAGNOSIS — R1310 Dysphagia, unspecified: Secondary | ICD-10-CM | POA: Diagnosis not present

## 2017-01-19 DIAGNOSIS — J690 Pneumonitis due to inhalation of food and vomit: Secondary | ICD-10-CM | POA: Diagnosis not present

## 2017-01-19 DIAGNOSIS — S22070D Wedge compression fracture of T9-T10 vertebra, subsequent encounter for fracture with routine healing: Secondary | ICD-10-CM | POA: Diagnosis not present

## 2017-01-19 DIAGNOSIS — S22060D Wedge compression fracture of T7-T8 vertebra, subsequent encounter for fracture with routine healing: Secondary | ICD-10-CM | POA: Diagnosis not present

## 2017-01-20 DIAGNOSIS — R1310 Dysphagia, unspecified: Secondary | ICD-10-CM | POA: Diagnosis not present

## 2017-01-20 DIAGNOSIS — S22070D Wedge compression fracture of T9-T10 vertebra, subsequent encounter for fracture with routine healing: Secondary | ICD-10-CM | POA: Diagnosis not present

## 2017-01-20 DIAGNOSIS — J690 Pneumonitis due to inhalation of food and vomit: Secondary | ICD-10-CM | POA: Diagnosis not present

## 2017-01-20 DIAGNOSIS — S22060D Wedge compression fracture of T7-T8 vertebra, subsequent encounter for fracture with routine healing: Secondary | ICD-10-CM | POA: Diagnosis not present

## 2017-01-20 DIAGNOSIS — S129XXD Fracture of neck, unspecified, subsequent encounter: Secondary | ICD-10-CM | POA: Diagnosis not present

## 2017-01-20 DIAGNOSIS — S3210XD Unspecified fracture of sacrum, subsequent encounter for fracture with routine healing: Secondary | ICD-10-CM | POA: Diagnosis not present

## 2017-01-21 DIAGNOSIS — S22060D Wedge compression fracture of T7-T8 vertebra, subsequent encounter for fracture with routine healing: Secondary | ICD-10-CM | POA: Diagnosis not present

## 2017-01-21 DIAGNOSIS — S129XXD Fracture of neck, unspecified, subsequent encounter: Secondary | ICD-10-CM | POA: Diagnosis not present

## 2017-01-21 DIAGNOSIS — S3210XD Unspecified fracture of sacrum, subsequent encounter for fracture with routine healing: Secondary | ICD-10-CM | POA: Diagnosis not present

## 2017-01-21 DIAGNOSIS — J690 Pneumonitis due to inhalation of food and vomit: Secondary | ICD-10-CM | POA: Diagnosis not present

## 2017-01-21 DIAGNOSIS — R1310 Dysphagia, unspecified: Secondary | ICD-10-CM | POA: Diagnosis not present

## 2017-01-21 DIAGNOSIS — S22070D Wedge compression fracture of T9-T10 vertebra, subsequent encounter for fracture with routine healing: Secondary | ICD-10-CM | POA: Diagnosis not present

## 2017-01-24 DIAGNOSIS — S129XXD Fracture of neck, unspecified, subsequent encounter: Secondary | ICD-10-CM | POA: Diagnosis not present

## 2017-01-24 DIAGNOSIS — S22070D Wedge compression fracture of T9-T10 vertebra, subsequent encounter for fracture with routine healing: Secondary | ICD-10-CM | POA: Diagnosis not present

## 2017-01-24 DIAGNOSIS — S3210XD Unspecified fracture of sacrum, subsequent encounter for fracture with routine healing: Secondary | ICD-10-CM | POA: Diagnosis not present

## 2017-01-24 DIAGNOSIS — S22060D Wedge compression fracture of T7-T8 vertebra, subsequent encounter for fracture with routine healing: Secondary | ICD-10-CM | POA: Diagnosis not present

## 2017-01-24 DIAGNOSIS — J690 Pneumonitis due to inhalation of food and vomit: Secondary | ICD-10-CM | POA: Diagnosis not present

## 2017-01-24 DIAGNOSIS — R1310 Dysphagia, unspecified: Secondary | ICD-10-CM | POA: Diagnosis not present

## 2017-01-25 DIAGNOSIS — R1310 Dysphagia, unspecified: Secondary | ICD-10-CM | POA: Diagnosis not present

## 2017-01-25 DIAGNOSIS — S3210XD Unspecified fracture of sacrum, subsequent encounter for fracture with routine healing: Secondary | ICD-10-CM | POA: Diagnosis not present

## 2017-01-25 DIAGNOSIS — J690 Pneumonitis due to inhalation of food and vomit: Secondary | ICD-10-CM | POA: Diagnosis not present

## 2017-01-25 DIAGNOSIS — K5900 Constipation, unspecified: Secondary | ICD-10-CM | POA: Diagnosis not present

## 2017-01-25 DIAGNOSIS — S22070D Wedge compression fracture of T9-T10 vertebra, subsequent encounter for fracture with routine healing: Secondary | ICD-10-CM | POA: Diagnosis not present

## 2017-01-25 DIAGNOSIS — Z6822 Body mass index (BMI) 22.0-22.9, adult: Secondary | ICD-10-CM | POA: Diagnosis not present

## 2017-01-25 DIAGNOSIS — E876 Hypokalemia: Secondary | ICD-10-CM | POA: Diagnosis not present

## 2017-01-25 DIAGNOSIS — S129XXD Fracture of neck, unspecified, subsequent encounter: Secondary | ICD-10-CM | POA: Diagnosis not present

## 2017-01-25 DIAGNOSIS — S129XXA Fracture of neck, unspecified, initial encounter: Secondary | ICD-10-CM | POA: Diagnosis not present

## 2017-01-25 DIAGNOSIS — E785 Hyperlipidemia, unspecified: Secondary | ICD-10-CM | POA: Diagnosis not present

## 2017-01-25 DIAGNOSIS — D649 Anemia, unspecified: Secondary | ICD-10-CM | POA: Diagnosis not present

## 2017-01-25 DIAGNOSIS — S22060D Wedge compression fracture of T7-T8 vertebra, subsequent encounter for fracture with routine healing: Secondary | ICD-10-CM | POA: Diagnosis not present

## 2017-01-25 DIAGNOSIS — I10 Essential (primary) hypertension: Secondary | ICD-10-CM | POA: Diagnosis not present

## 2017-01-25 DIAGNOSIS — N400 Enlarged prostate without lower urinary tract symptoms: Secondary | ICD-10-CM | POA: Diagnosis not present

## 2017-01-25 DIAGNOSIS — Z299 Encounter for prophylactic measures, unspecified: Secondary | ICD-10-CM | POA: Diagnosis not present

## 2017-01-25 DIAGNOSIS — Z09 Encounter for follow-up examination after completed treatment for conditions other than malignant neoplasm: Secondary | ICD-10-CM | POA: Diagnosis not present

## 2017-01-26 DIAGNOSIS — J690 Pneumonitis due to inhalation of food and vomit: Secondary | ICD-10-CM | POA: Diagnosis not present

## 2017-01-26 DIAGNOSIS — S3210XD Unspecified fracture of sacrum, subsequent encounter for fracture with routine healing: Secondary | ICD-10-CM | POA: Diagnosis not present

## 2017-01-26 DIAGNOSIS — S22070D Wedge compression fracture of T9-T10 vertebra, subsequent encounter for fracture with routine healing: Secondary | ICD-10-CM | POA: Diagnosis not present

## 2017-01-26 DIAGNOSIS — S22060D Wedge compression fracture of T7-T8 vertebra, subsequent encounter for fracture with routine healing: Secondary | ICD-10-CM | POA: Diagnosis not present

## 2017-01-26 DIAGNOSIS — R1310 Dysphagia, unspecified: Secondary | ICD-10-CM | POA: Diagnosis not present

## 2017-01-26 DIAGNOSIS — S129XXD Fracture of neck, unspecified, subsequent encounter: Secondary | ICD-10-CM | POA: Diagnosis not present

## 2017-01-27 DIAGNOSIS — R1310 Dysphagia, unspecified: Secondary | ICD-10-CM | POA: Diagnosis not present

## 2017-01-27 DIAGNOSIS — S3210XD Unspecified fracture of sacrum, subsequent encounter for fracture with routine healing: Secondary | ICD-10-CM | POA: Diagnosis not present

## 2017-01-27 DIAGNOSIS — S129XXD Fracture of neck, unspecified, subsequent encounter: Secondary | ICD-10-CM | POA: Diagnosis not present

## 2017-01-27 DIAGNOSIS — S22060D Wedge compression fracture of T7-T8 vertebra, subsequent encounter for fracture with routine healing: Secondary | ICD-10-CM | POA: Diagnosis not present

## 2017-01-27 DIAGNOSIS — J690 Pneumonitis due to inhalation of food and vomit: Secondary | ICD-10-CM | POA: Diagnosis not present

## 2017-01-27 DIAGNOSIS — S22070D Wedge compression fracture of T9-T10 vertebra, subsequent encounter for fracture with routine healing: Secondary | ICD-10-CM | POA: Diagnosis not present

## 2017-01-28 DIAGNOSIS — S3210XD Unspecified fracture of sacrum, subsequent encounter for fracture with routine healing: Secondary | ICD-10-CM | POA: Diagnosis not present

## 2017-01-28 DIAGNOSIS — S22060D Wedge compression fracture of T7-T8 vertebra, subsequent encounter for fracture with routine healing: Secondary | ICD-10-CM | POA: Diagnosis not present

## 2017-01-28 DIAGNOSIS — J690 Pneumonitis due to inhalation of food and vomit: Secondary | ICD-10-CM | POA: Diagnosis not present

## 2017-01-28 DIAGNOSIS — R1310 Dysphagia, unspecified: Secondary | ICD-10-CM | POA: Diagnosis not present

## 2017-01-28 DIAGNOSIS — S22070D Wedge compression fracture of T9-T10 vertebra, subsequent encounter for fracture with routine healing: Secondary | ICD-10-CM | POA: Diagnosis not present

## 2017-01-28 DIAGNOSIS — S129XXD Fracture of neck, unspecified, subsequent encounter: Secondary | ICD-10-CM | POA: Diagnosis not present

## 2017-01-31 DIAGNOSIS — S3210XD Unspecified fracture of sacrum, subsequent encounter for fracture with routine healing: Secondary | ICD-10-CM | POA: Diagnosis not present

## 2017-01-31 DIAGNOSIS — S22060D Wedge compression fracture of T7-T8 vertebra, subsequent encounter for fracture with routine healing: Secondary | ICD-10-CM | POA: Diagnosis not present

## 2017-01-31 DIAGNOSIS — J690 Pneumonitis due to inhalation of food and vomit: Secondary | ICD-10-CM | POA: Diagnosis not present

## 2017-01-31 DIAGNOSIS — S129XXD Fracture of neck, unspecified, subsequent encounter: Secondary | ICD-10-CM | POA: Diagnosis not present

## 2017-01-31 DIAGNOSIS — R1310 Dysphagia, unspecified: Secondary | ICD-10-CM | POA: Diagnosis not present

## 2017-01-31 DIAGNOSIS — S22070D Wedge compression fracture of T9-T10 vertebra, subsequent encounter for fracture with routine healing: Secondary | ICD-10-CM | POA: Diagnosis not present

## 2017-02-01 DIAGNOSIS — S129XXD Fracture of neck, unspecified, subsequent encounter: Secondary | ICD-10-CM | POA: Diagnosis not present

## 2017-02-01 DIAGNOSIS — S3210XD Unspecified fracture of sacrum, subsequent encounter for fracture with routine healing: Secondary | ICD-10-CM | POA: Diagnosis not present

## 2017-02-01 DIAGNOSIS — J690 Pneumonitis due to inhalation of food and vomit: Secondary | ICD-10-CM | POA: Diagnosis not present

## 2017-02-01 DIAGNOSIS — R1310 Dysphagia, unspecified: Secondary | ICD-10-CM | POA: Diagnosis not present

## 2017-02-01 DIAGNOSIS — S22070D Wedge compression fracture of T9-T10 vertebra, subsequent encounter for fracture with routine healing: Secondary | ICD-10-CM | POA: Diagnosis not present

## 2017-02-01 DIAGNOSIS — S22060D Wedge compression fracture of T7-T8 vertebra, subsequent encounter for fracture with routine healing: Secondary | ICD-10-CM | POA: Diagnosis not present

## 2017-02-02 DIAGNOSIS — S22070D Wedge compression fracture of T9-T10 vertebra, subsequent encounter for fracture with routine healing: Secondary | ICD-10-CM | POA: Diagnosis not present

## 2017-02-02 DIAGNOSIS — S22060D Wedge compression fracture of T7-T8 vertebra, subsequent encounter for fracture with routine healing: Secondary | ICD-10-CM | POA: Diagnosis not present

## 2017-02-02 DIAGNOSIS — I10 Essential (primary) hypertension: Secondary | ICD-10-CM | POA: Diagnosis not present

## 2017-02-02 DIAGNOSIS — S3210XD Unspecified fracture of sacrum, subsequent encounter for fracture with routine healing: Secondary | ICD-10-CM | POA: Diagnosis not present

## 2017-02-02 DIAGNOSIS — J690 Pneumonitis due to inhalation of food and vomit: Secondary | ICD-10-CM | POA: Diagnosis not present

## 2017-02-02 DIAGNOSIS — S129XXD Fracture of neck, unspecified, subsequent encounter: Secondary | ICD-10-CM | POA: Diagnosis not present

## 2017-02-02 DIAGNOSIS — R1310 Dysphagia, unspecified: Secondary | ICD-10-CM | POA: Diagnosis not present

## 2017-02-03 DIAGNOSIS — S22070D Wedge compression fracture of T9-T10 vertebra, subsequent encounter for fracture with routine healing: Secondary | ICD-10-CM | POA: Diagnosis not present

## 2017-02-03 DIAGNOSIS — S129XXD Fracture of neck, unspecified, subsequent encounter: Secondary | ICD-10-CM | POA: Diagnosis not present

## 2017-02-03 DIAGNOSIS — S3210XD Unspecified fracture of sacrum, subsequent encounter for fracture with routine healing: Secondary | ICD-10-CM | POA: Diagnosis not present

## 2017-02-03 DIAGNOSIS — S22060D Wedge compression fracture of T7-T8 vertebra, subsequent encounter for fracture with routine healing: Secondary | ICD-10-CM | POA: Diagnosis not present

## 2017-02-03 DIAGNOSIS — R1310 Dysphagia, unspecified: Secondary | ICD-10-CM | POA: Diagnosis not present

## 2017-02-03 DIAGNOSIS — J690 Pneumonitis due to inhalation of food and vomit: Secondary | ICD-10-CM | POA: Diagnosis not present

## 2017-02-07 DIAGNOSIS — S3210XD Unspecified fracture of sacrum, subsequent encounter for fracture with routine healing: Secondary | ICD-10-CM | POA: Diagnosis not present

## 2017-02-07 DIAGNOSIS — S22070D Wedge compression fracture of T9-T10 vertebra, subsequent encounter for fracture with routine healing: Secondary | ICD-10-CM | POA: Diagnosis not present

## 2017-02-07 DIAGNOSIS — S129XXD Fracture of neck, unspecified, subsequent encounter: Secondary | ICD-10-CM | POA: Diagnosis not present

## 2017-02-07 DIAGNOSIS — R1310 Dysphagia, unspecified: Secondary | ICD-10-CM | POA: Diagnosis not present

## 2017-02-07 DIAGNOSIS — J690 Pneumonitis due to inhalation of food and vomit: Secondary | ICD-10-CM | POA: Diagnosis not present

## 2017-02-07 DIAGNOSIS — S22060D Wedge compression fracture of T7-T8 vertebra, subsequent encounter for fracture with routine healing: Secondary | ICD-10-CM | POA: Diagnosis not present

## 2017-02-08 DIAGNOSIS — S22070D Wedge compression fracture of T9-T10 vertebra, subsequent encounter for fracture with routine healing: Secondary | ICD-10-CM | POA: Diagnosis not present

## 2017-02-08 DIAGNOSIS — J690 Pneumonitis due to inhalation of food and vomit: Secondary | ICD-10-CM | POA: Diagnosis not present

## 2017-02-08 DIAGNOSIS — S129XXD Fracture of neck, unspecified, subsequent encounter: Secondary | ICD-10-CM | POA: Diagnosis not present

## 2017-02-08 DIAGNOSIS — Z931 Gastrostomy status: Secondary | ICD-10-CM | POA: Diagnosis not present

## 2017-02-08 DIAGNOSIS — R1310 Dysphagia, unspecified: Secondary | ICD-10-CM | POA: Diagnosis not present

## 2017-02-08 DIAGNOSIS — S3210XD Unspecified fracture of sacrum, subsequent encounter for fracture with routine healing: Secondary | ICD-10-CM | POA: Diagnosis not present

## 2017-02-08 DIAGNOSIS — S22060D Wedge compression fracture of T7-T8 vertebra, subsequent encounter for fracture with routine healing: Secondary | ICD-10-CM | POA: Diagnosis not present

## 2017-02-09 DIAGNOSIS — J690 Pneumonitis due to inhalation of food and vomit: Secondary | ICD-10-CM | POA: Diagnosis not present

## 2017-02-09 DIAGNOSIS — S3210XD Unspecified fracture of sacrum, subsequent encounter for fracture with routine healing: Secondary | ICD-10-CM | POA: Diagnosis not present

## 2017-02-09 DIAGNOSIS — S22060D Wedge compression fracture of T7-T8 vertebra, subsequent encounter for fracture with routine healing: Secondary | ICD-10-CM | POA: Diagnosis not present

## 2017-02-09 DIAGNOSIS — S22070D Wedge compression fracture of T9-T10 vertebra, subsequent encounter for fracture with routine healing: Secondary | ICD-10-CM | POA: Diagnosis not present

## 2017-02-09 DIAGNOSIS — R1310 Dysphagia, unspecified: Secondary | ICD-10-CM | POA: Diagnosis not present

## 2017-02-09 DIAGNOSIS — S129XXD Fracture of neck, unspecified, subsequent encounter: Secondary | ICD-10-CM | POA: Diagnosis not present

## 2017-02-15 DIAGNOSIS — J690 Pneumonitis due to inhalation of food and vomit: Secondary | ICD-10-CM | POA: Diagnosis not present

## 2017-02-15 DIAGNOSIS — R1310 Dysphagia, unspecified: Secondary | ICD-10-CM | POA: Diagnosis not present

## 2017-02-15 DIAGNOSIS — S22070D Wedge compression fracture of T9-T10 vertebra, subsequent encounter for fracture with routine healing: Secondary | ICD-10-CM | POA: Diagnosis not present

## 2017-02-15 DIAGNOSIS — S3210XD Unspecified fracture of sacrum, subsequent encounter for fracture with routine healing: Secondary | ICD-10-CM | POA: Diagnosis not present

## 2017-02-15 DIAGNOSIS — S22060D Wedge compression fracture of T7-T8 vertebra, subsequent encounter for fracture with routine healing: Secondary | ICD-10-CM | POA: Diagnosis not present

## 2017-02-15 DIAGNOSIS — S129XXD Fracture of neck, unspecified, subsequent encounter: Secondary | ICD-10-CM | POA: Diagnosis not present

## 2017-02-22 DIAGNOSIS — E785 Hyperlipidemia, unspecified: Secondary | ICD-10-CM | POA: Diagnosis not present

## 2017-02-22 DIAGNOSIS — Z299 Encounter for prophylactic measures, unspecified: Secondary | ICD-10-CM | POA: Diagnosis not present

## 2017-02-22 DIAGNOSIS — I10 Essential (primary) hypertension: Secondary | ICD-10-CM | POA: Diagnosis not present

## 2017-02-22 DIAGNOSIS — Z6822 Body mass index (BMI) 22.0-22.9, adult: Secondary | ICD-10-CM | POA: Diagnosis not present

## 2017-02-22 DIAGNOSIS — N400 Enlarged prostate without lower urinary tract symptoms: Secondary | ICD-10-CM | POA: Diagnosis not present

## 2017-02-23 DIAGNOSIS — S22060D Wedge compression fracture of T7-T8 vertebra, subsequent encounter for fracture with routine healing: Secondary | ICD-10-CM | POA: Diagnosis not present

## 2017-02-23 DIAGNOSIS — S3210XD Unspecified fracture of sacrum, subsequent encounter for fracture with routine healing: Secondary | ICD-10-CM | POA: Diagnosis not present

## 2017-02-23 DIAGNOSIS — S22070D Wedge compression fracture of T9-T10 vertebra, subsequent encounter for fracture with routine healing: Secondary | ICD-10-CM | POA: Diagnosis not present

## 2017-02-23 DIAGNOSIS — J690 Pneumonitis due to inhalation of food and vomit: Secondary | ICD-10-CM | POA: Diagnosis not present

## 2017-02-23 DIAGNOSIS — S129XXD Fracture of neck, unspecified, subsequent encounter: Secondary | ICD-10-CM | POA: Diagnosis not present

## 2017-02-23 DIAGNOSIS — R1310 Dysphagia, unspecified: Secondary | ICD-10-CM | POA: Diagnosis not present

## 2017-03-01 DIAGNOSIS — S22070D Wedge compression fracture of T9-T10 vertebra, subsequent encounter for fracture with routine healing: Secondary | ICD-10-CM | POA: Diagnosis not present

## 2017-03-01 DIAGNOSIS — S129XXD Fracture of neck, unspecified, subsequent encounter: Secondary | ICD-10-CM | POA: Diagnosis not present

## 2017-03-01 DIAGNOSIS — J690 Pneumonitis due to inhalation of food and vomit: Secondary | ICD-10-CM | POA: Diagnosis not present

## 2017-03-01 DIAGNOSIS — S3210XD Unspecified fracture of sacrum, subsequent encounter for fracture with routine healing: Secondary | ICD-10-CM | POA: Diagnosis not present

## 2017-03-01 DIAGNOSIS — R1310 Dysphagia, unspecified: Secondary | ICD-10-CM | POA: Diagnosis not present

## 2017-03-01 DIAGNOSIS — S22060D Wedge compression fracture of T7-T8 vertebra, subsequent encounter for fracture with routine healing: Secondary | ICD-10-CM | POA: Diagnosis not present

## 2017-03-08 DIAGNOSIS — S129XXD Fracture of neck, unspecified, subsequent encounter: Secondary | ICD-10-CM | POA: Diagnosis not present

## 2017-03-08 DIAGNOSIS — J690 Pneumonitis due to inhalation of food and vomit: Secondary | ICD-10-CM | POA: Diagnosis not present

## 2017-03-08 DIAGNOSIS — R1310 Dysphagia, unspecified: Secondary | ICD-10-CM | POA: Diagnosis not present

## 2017-03-08 DIAGNOSIS — S3210XD Unspecified fracture of sacrum, subsequent encounter for fracture with routine healing: Secondary | ICD-10-CM | POA: Diagnosis not present

## 2017-03-08 DIAGNOSIS — S22060D Wedge compression fracture of T7-T8 vertebra, subsequent encounter for fracture with routine healing: Secondary | ICD-10-CM | POA: Diagnosis not present

## 2017-03-08 DIAGNOSIS — S22070D Wedge compression fracture of T9-T10 vertebra, subsequent encounter for fracture with routine healing: Secondary | ICD-10-CM | POA: Diagnosis not present

## 2017-03-09 DIAGNOSIS — I10 Essential (primary) hypertension: Secondary | ICD-10-CM | POA: Diagnosis not present

## 2017-03-09 DIAGNOSIS — E7800 Pure hypercholesterolemia, unspecified: Secondary | ICD-10-CM | POA: Diagnosis not present

## 2017-03-09 DIAGNOSIS — M159 Polyosteoarthritis, unspecified: Secondary | ICD-10-CM | POA: Diagnosis not present

## 2017-03-28 DIAGNOSIS — Z299 Encounter for prophylactic measures, unspecified: Secondary | ICD-10-CM | POA: Diagnosis not present

## 2017-03-28 DIAGNOSIS — Z7189 Other specified counseling: Secondary | ICD-10-CM | POA: Diagnosis not present

## 2017-03-28 DIAGNOSIS — E038 Other specified hypothyroidism: Secondary | ICD-10-CM | POA: Diagnosis not present

## 2017-03-28 DIAGNOSIS — I10 Essential (primary) hypertension: Secondary | ICD-10-CM | POA: Diagnosis not present

## 2017-03-28 DIAGNOSIS — Z6821 Body mass index (BMI) 21.0-21.9, adult: Secondary | ICD-10-CM | POA: Diagnosis not present

## 2017-03-28 DIAGNOSIS — Z1339 Encounter for screening examination for other mental health and behavioral disorders: Secondary | ICD-10-CM | POA: Diagnosis not present

## 2017-03-28 DIAGNOSIS — Z1331 Encounter for screening for depression: Secondary | ICD-10-CM | POA: Diagnosis not present

## 2017-03-28 DIAGNOSIS — R634 Abnormal weight loss: Secondary | ICD-10-CM | POA: Diagnosis not present

## 2017-03-28 DIAGNOSIS — Z79899 Other long term (current) drug therapy: Secondary | ICD-10-CM | POA: Diagnosis not present

## 2017-03-28 DIAGNOSIS — Z0000 Encounter for general adult medical examination without abnormal findings: Secondary | ICD-10-CM | POA: Diagnosis not present

## 2017-03-28 DIAGNOSIS — R5383 Other fatigue: Secondary | ICD-10-CM | POA: Diagnosis not present

## 2017-03-28 DIAGNOSIS — E785 Hyperlipidemia, unspecified: Secondary | ICD-10-CM | POA: Diagnosis not present

## 2017-03-30 DIAGNOSIS — Z125 Encounter for screening for malignant neoplasm of prostate: Secondary | ICD-10-CM | POA: Diagnosis not present

## 2017-03-30 DIAGNOSIS — R5383 Other fatigue: Secondary | ICD-10-CM | POA: Diagnosis not present

## 2017-03-30 DIAGNOSIS — E785 Hyperlipidemia, unspecified: Secondary | ICD-10-CM | POA: Diagnosis not present

## 2017-03-30 DIAGNOSIS — E038 Other specified hypothyroidism: Secondary | ICD-10-CM | POA: Diagnosis not present

## 2017-03-30 DIAGNOSIS — Z79899 Other long term (current) drug therapy: Secondary | ICD-10-CM | POA: Diagnosis not present

## 2017-05-19 DIAGNOSIS — I10 Essential (primary) hypertension: Secondary | ICD-10-CM | POA: Diagnosis not present

## 2017-05-19 DIAGNOSIS — E7800 Pure hypercholesterolemia, unspecified: Secondary | ICD-10-CM | POA: Diagnosis not present

## 2017-05-19 DIAGNOSIS — M159 Polyosteoarthritis, unspecified: Secondary | ICD-10-CM | POA: Diagnosis not present

## 2017-07-25 DIAGNOSIS — I10 Essential (primary) hypertension: Secondary | ICD-10-CM | POA: Diagnosis not present

## 2017-07-25 DIAGNOSIS — E7800 Pure hypercholesterolemia, unspecified: Secondary | ICD-10-CM | POA: Diagnosis not present

## 2017-07-25 DIAGNOSIS — M159 Polyosteoarthritis, unspecified: Secondary | ICD-10-CM | POA: Diagnosis not present

## 2017-08-01 DIAGNOSIS — E785 Hyperlipidemia, unspecified: Secondary | ICD-10-CM | POA: Diagnosis not present

## 2017-08-01 DIAGNOSIS — N400 Enlarged prostate without lower urinary tract symptoms: Secondary | ICD-10-CM | POA: Diagnosis not present

## 2017-08-01 DIAGNOSIS — Z299 Encounter for prophylactic measures, unspecified: Secondary | ICD-10-CM | POA: Diagnosis not present

## 2017-08-01 DIAGNOSIS — M542 Cervicalgia: Secondary | ICD-10-CM | POA: Diagnosis not present

## 2017-08-01 DIAGNOSIS — I10 Essential (primary) hypertension: Secondary | ICD-10-CM | POA: Diagnosis not present

## 2017-08-01 DIAGNOSIS — S129XXA Fracture of neck, unspecified, initial encounter: Secondary | ICD-10-CM | POA: Diagnosis not present

## 2017-08-01 DIAGNOSIS — Z6821 Body mass index (BMI) 21.0-21.9, adult: Secondary | ICD-10-CM | POA: Diagnosis not present

## 2017-08-04 DIAGNOSIS — H524 Presbyopia: Secondary | ICD-10-CM | POA: Diagnosis not present

## 2017-08-04 DIAGNOSIS — H26491 Other secondary cataract, right eye: Secondary | ICD-10-CM | POA: Diagnosis not present

## 2017-08-04 DIAGNOSIS — Z961 Presence of intraocular lens: Secondary | ICD-10-CM | POA: Diagnosis not present

## 2017-08-04 DIAGNOSIS — H35362 Drusen (degenerative) of macula, left eye: Secondary | ICD-10-CM | POA: Diagnosis not present

## 2017-08-04 DIAGNOSIS — D3132 Benign neoplasm of left choroid: Secondary | ICD-10-CM | POA: Diagnosis not present

## 2017-08-04 DIAGNOSIS — H02403 Unspecified ptosis of bilateral eyelids: Secondary | ICD-10-CM | POA: Diagnosis not present

## 2017-08-04 DIAGNOSIS — Z9842 Cataract extraction status, left eye: Secondary | ICD-10-CM | POA: Diagnosis not present

## 2017-08-04 DIAGNOSIS — H3562 Retinal hemorrhage, left eye: Secondary | ICD-10-CM | POA: Diagnosis not present

## 2017-08-19 NOTE — Progress Notes (Signed)
[facility] Note  08/22/2017     CHIEF COMPLAINT Patient presents for Retina Evaluation   HISTORY OF PRESENT ILLNESS: Jacob Oliver is a 84 y.o. male who presents to the clinic today for:   HPI    Retina Evaluation    In both eyes.  This started 1 month ago.  Associated Symptoms Negative for Flashes, Pain, Trauma, Fever, Scalp Tenderness, Weight Loss, Redness, Floaters, Distortion, Photophobia, Jaw Claudication, Fatigue, Shoulder/Hip pain, Glare and Blind Spot.  Context:  distance vision, mid-range vision and near vision.  Treatments tried include artificial tears.  I, the attending physician,  performed the HPI with the patient and updated documentation appropriately.          Comments    Referral of DR. Leander Rams for retina eval. Patient states he saw Dr. Rosana Hoes for eye exam,she told him "he had a spot on one of his eyes that need to be checked further". Denies wavy vision, flashes and ocular pain. Pt reports he had a fall 10/2016, he hit his forehead and received sutures in the back of his head. Denies visual changes. Pt reports he had cataract sx 2009/2002.        Last edited by Bernarda Caffey, MD on 08/22/2017  4:05 PM. (History)    Pt states he was seen by Dr. Rosana Hoes for routine exam; Pt states he got a new pair of specs and states Dr. Rosana Hoes "found a spot" in OS; Pt denies any VA change, denies floaters, denies flashes, denies wavy VA;   Referring physician: Leticia Clas, DO [address] [place]. [place], [place] [address]  HISTORICAL INFORMATION:   Selected notes from the MEDICAL RECORD NUMBER Referred by Dr. Leander Rams for concern of Exudative AMD OS;  LEE- 02.21.19 ([facility]) [BCVA OD: 20/30 OS:20/20] Ocular Hx- pseudophakia OU ([facility] 10+ years ago) PMH-     CURRENT MEDICATIONS: No current outpatient medications on file. (Ophthalmic Drugs)   No current facility-administered medications for this visit.  (Ophthalmic Drugs)    Current Outpatient Medications (Other)  Medication Sig  . Calcium Carb-Ergocalciferol 500-200 MG-UNIT TABS Take by mouth.  Mariane Baumgarten Sodium 150 MG/15ML syrup Take 50 mg by mouth.  . ferrous sulfate 220 (44 Fe) MG/5ML solution Take 220 mg by mouth.  Marland Kitchen omeprazole (PRILOSEC) 20 MG capsule Take 20 mg by mouth.  . vitamin B-12 (CYANOCOBALAMIN) 500 MCG tablet Take by mouth.  . Vitamin D, Ergocalciferol, (DRISDOL) 50000 units CAPS capsule Take by mouth.   No current facility-administered medications for this visit.  (Other)      REVIEW OF SYSTEMS: ROS    Positive for: Musculoskeletal, Cardiovascular, Eyes   Negative for: Constitutional, Gastrointestinal, Neurological, Skin, Genitourinary, HENT, Endocrine, Respiratory, Psychiatric, Allergic/Imm, Heme/Lymph   Last edited by Zenovia Jordan, LPN on 11/28/735  1:06 PM. (History)       ALLERGIES No Known Allergies  PAST MEDICAL HISTORY Past Medical History:  Diagnosis Date  . Cataract    Past Surgical History:  Procedure Laterality Date  . CATARACT EXTRACTION  2009/2002  . EYE SURGERY      FAMILY HISTORY Family History  Problem Relation Age of Onset  . Macular degeneration Mother   . Cataracts Mother   . Macular degeneration Sister     SOCIAL HISTORY Social History   Tobacco Use  . Smoking status: Former Research scientist (life sciences)  . Smokeless tobacco: Never Used  Substance Use Topics  . Alcohol use: Not on file  .  Drug use: Not on file         OPHTHALMIC EXAM:  Base Eye Exam    Visual Acuity (Snellen - Linear)      Right Left   Dist cc 20/40 20/30   Dist ph cc 20/30 -2 NI   Correction:  Glasses       Tonometry (Tonopen, 3:23 PM)      Right Left   Pressure 17 19       Pupils      Dark Light Shape React APD   Right 5 3 Round 2 None   Left 5 3 Irregular 2 None       Visual Fields (Counting fingers)      Left Right    Full Full       Extraocular Movement      Right Left    Full, Ortho Full, Ortho        Neuro/Psych    Oriented x3:  Yes   Mood/Affect:  Normal       Dilation    Both eyes:  1.0% Mydriacyl, 2.5% Phenylephrine @ 2:55 pm        Slit Lamp and Fundus Exam    Slit Lamp Exam      Right Left   Lids/Lashes Telangiectasia Telangiectasia   Conjunctiva/Sclera White and quiet White and quiet   Cornea Trace Descemet's folds, Arcus Arcus   Anterior Chamber Deep and quiet Deep and quiet   Iris Round and dilated Round and dilated   Lens PC IOL in good position, Nasal 2-3+ Posterior capsular opacification approaching visual axis PC IOL in good position, clear PC   Vitreous Posterior vitreous detachment syneresis       Fundus Exam      Right Left   Disc Normal Normal   C/D Ratio 0.2 0.4   Macula Good foveal reflex, mild Retinal pigment epithelial mottling, No heme or edema Good foveal reflex, IRHs temporal to fovea, Exudates temporal to fovea, small flat Choroidal nevus nasal to fovea   Vessels Vascular attenuation Vascular attenuation   Periphery Attached Attached, mid-zonal drusen        Refraction    Wearing Rx      Sphere Cylinder Axis   Right [place] +1.25 007   Left -1.50 +2.00 177       Manifest Refraction      Sphere Cylinder Axis Dist VA   Right -0.50 +1.00 180 20/30+2   Left -2.00 +2.00 175 20/30+2  Difficult MRX          IMAGING AND PROCEDURES  Imaging and Procedures for 08/23/17  OCT, Retina - OU - Both Eyes     Right Eye Quality was good. Central Foveal Thickness: 259. Progression has no prior data. Findings include normal foveal contour, no IRF, no SRF, retinal drusen .   Left Eye Quality was good. Central Foveal Thickness: 299. Progression has no prior data. Findings include abnormal foveal contour, retinal drusen , outer retinal atrophy, intraretinal fluid, intraretinal hyper-reflective material.   Notes *Images captured and stored on drive  Diagnosis / Impression:  OD: retinal drusen OS: focal IRF with surrounding hyper-reflective material  -- temporal fovea  Clinical management:  See below  Abbreviations: NFP - Normal foveal profile. CME - cystoid macular edema. PED - pigment epithelial detachment. IRF - intraretinal fluid. SRF - subretinal fluid. EZ - ellipsoid zone. ERM - epiretinal membrane. ORA - outer retinal atrophy. ORT - outer retinal tubulation. SRHM - subretinal hyper-reflective material  Fluorescein Angiography Optos (Transit OS)     Right Eye Early phase findings include normal observations, staining. Mid/Late phase findings include normal observations, staining.   Left Eye Early phase findings include leakage, choroidal neovascularization. Mid/Late phase findings include leakage, choroidal neovascularization.   Notes Impression:  OD: focal area of hyperfluorescent staining superior to arcades OS: focal areas of hyperfluorescent leakage temporal to fovea -- CNV -- possible RAP lesions                ASSESSMENT/PLAN:    ICD-10-CM   1. Exudative age-related macular degeneration of left eye with active choroidal neovascularization (HCC) N56.2130 Fluorescein Angiography Optos (Transit OS)  2. Retinal edema H35.81 OCT, Retina - OU - Both Eyes    Fluorescein Angiography Optos (Transit OS)  3. Pseudophakia of both eyes Z96.1     1,2. Exudative age related macular degeneration, both eyes.    - The incidence pathology and anatomy of wet AMD discussed   - The ANCHOR, MARINA, CATT and VIEW trials discussed with patient.    - discussed treatment options including observation vs intravitreal anti-VEGF agents such as Avastin, Lucentis, Eylea.    - Risks of endophthalmitis and vascular occlusive events and atrophic changes discussed with patient  - OCT with mild IRF OS  - FA shows mild CNV, possible RAP lesion temporal macula  - BCVA 20/30 -- pt wishes to observe for now -- reasonable  - f/u in 4-6 wks, sooner prn  3. Pseudophakia OU-   - s/p CE/IOL  - beautiful surgery, doing well  -  monitor   Ophthalmic Meds Ordered this visit:  No orders of the defined types were placed in this encounter.      Return in about 4 weeks (around 09/19/2017) for Dilated Exam, OCT.  There are no Patient Instructions on file for this visit.   Explained the diagnoses, plan, and follow up with the patient and they expressed understanding.  Patient expressed understanding of the importance of proper follow up care.   This document serves as a record of services personally performed by Gardiner Sleeper, MD, PhD. It was created on their behalf by Catha Brow, [place], a certified ophthalmic assistant. The creation of this record is the provider's dictation and/or activities during the visit.  Electronically signed by: Catha Brow, [place]  08/23/17 9:26 AM   Gardiner Sleeper, M.D., Ph.D. Diseases & Surgery of the Retina and [place] 08/23/17   I have reviewed the above documentation for accuracy and completeness, and I agree with the above. Gardiner Sleeper, M.D., Ph.D. 08/23/17 9:26 AM     Abbreviations: M myopia (nearsighted); A astigmatism; H hyperopia (farsighted); P presbyopia; Mrx spectacle prescription;  CTL contact lenses; OD right eye; OS left eye; OU both eyes  XT exotropia; ET esotropia; PEK punctate epithelial keratitis; PEE punctate epithelial erosions; DES dry eye syndrome; MGD meibomian gland dysfunction; ATs artificial tears; PFAT's preservative free artificial tears; [place] nuclear sclerotic cataract; PSC posterior subcapsular cataract; ERM epi-retinal membrane; PVD posterior vitreous detachment; RD retinal detachment; DM diabetes mellitus; DR diabetic retinopathy; NPDR non-proliferative diabetic retinopathy; PDR proliferative diabetic retinopathy; CSME clinically significant macular edema; DME diabetic macular edema; dbh dot blot hemorrhages; CWS cotton wool spot; POAG primary open angle glaucoma; C/D cup-to-disc ratio; HVF humphrey visual  field; GVF goldmann visual field; OCT optical coherence tomography; IOP intraocular pressure; BRVO Branch retinal vein occlusion; CRVO central retinal vein occlusion; CRAO central retinal artery occlusion; BRAO branch  retinal artery occlusion; RT retinal tear; SB scleral buckle; PPV pars plana vitrectomy; VH Vitreous hemorrhage; PRP panretinal laser photocoagulation; IVK intravitreal kenalog; VMT vitreomacular traction; MH Macular hole;  NVD neovascularization of the disc; NVE neovascularization elsewhere; AREDS age related eye disease study; ARMD age related macular degeneration; POAG primary open angle glaucoma; EBMD epithelial/anterior basement membrane dystrophy; ACIOL anterior chamber intraocular lens; IOL intraocular lens; PCIOL posterior chamber intraocular lens; Phaco/IOL phacoemulsification with intraocular lens placement; [place] photorefractive keratectomy; LASIK laser assisted in situ keratomileusis; HTN hypertension; DM diabetes mellitus; COPD chronic obstructive pulmonary disease

## 2017-08-22 ENCOUNTER — Encounter (INDEPENDENT_AMBULATORY_CARE_PROVIDER_SITE_OTHER): Payer: Self-pay | Admitting: Ophthalmology

## 2017-08-22 ENCOUNTER — Ambulatory Visit (INDEPENDENT_AMBULATORY_CARE_PROVIDER_SITE_OTHER): Payer: Medicare Other | Admitting: Ophthalmology

## 2017-08-22 DIAGNOSIS — Z961 Presence of intraocular lens: Secondary | ICD-10-CM | POA: Diagnosis not present

## 2017-08-22 DIAGNOSIS — H3581 Retinal edema: Secondary | ICD-10-CM

## 2017-08-22 DIAGNOSIS — H353221 Exudative age-related macular degeneration, left eye, with active choroidal neovascularization: Secondary | ICD-10-CM

## 2017-08-23 ENCOUNTER — Encounter (INDEPENDENT_AMBULATORY_CARE_PROVIDER_SITE_OTHER): Payer: Self-pay | Admitting: Ophthalmology

## 2017-09-27 NOTE — Progress Notes (Signed)
[facility] Note  09/28/2017     CHIEF COMPLAINT Patient presents for Retina Follow Up   HISTORY OF PRESENT ILLNESS: Jacob Oliver is a 84 y.o. male who presents to the clinic today for:   HPI    Retina Follow Up    Patient presents with  Wet AMD.  In left eye.  Severity is moderate.  Duration of 5 weeks.  Since onset it is stable.  I, the attending physician,  performed the HPI with the patient and updated documentation appropriately.          Comments    Pt presents for Exu ARMD F/U OS, pts wife states VA is stable since last visit, pt states hes been having trouble with allergies lately, pt denies flashes, floaters, pain or wavy vision, pt uses OTC gtts PRN,        Last edited by Bernarda Caffey, MD on 09/28/2017 11:20 AM. (History)    Pt states he feels VA is "doing pretty good";   Referring physician: No referring provider defined for this encounter.  HISTORICAL INFORMATION:   Selected notes from the MEDICAL RECORD NUMBER Referred by Dr. Leander Rams for concern of Exudative AMD OS;  LEE- 02.21.19 ([facility]) [BCVA OD: 20/30 OS:20/20] Ocular Hx- pseudophakia OU ([facility] 10+ years ago) PMH-     CURRENT MEDICATIONS: No current outpatient medications on file. (Ophthalmic Drugs)   No current facility-administered medications for this visit.  (Ophthalmic Drugs)   Current Outpatient Medications (Other)  Medication Sig  . Calcium Carb-Ergocalciferol 500-200 MG-UNIT TABS Take by mouth.  Mariane Baumgarten Sodium 150 MG/15ML syrup Take 50 mg by mouth.  . ferrous sulfate 220 (44 Fe) MG/5ML solution Take 220 mg by mouth.  Marland Kitchen omeprazole (PRILOSEC) 20 MG capsule Take 20 mg by mouth.  . vitamin B-12 (CYANOCOBALAMIN) 500 MCG tablet Take by mouth.  . Vitamin D, Ergocalciferol, (DRISDOL) 50000 units CAPS capsule Take by mouth.   No current facility-administered medications for this visit.  (Other)      REVIEW OF SYSTEMS: ROS    Positive for: Eyes,  Allergic/Imm   Negative for: Constitutional, Gastrointestinal, Neurological, Skin, Genitourinary, Musculoskeletal, HENT, Endocrine, Cardiovascular, Respiratory, Psychiatric, Heme/Lymph   Last edited by Debbrah Alar, COT on 09/28/2017 10:45 AM. (History)       ALLERGIES No Known Allergies  PAST MEDICAL HISTORY Past Medical History:  Diagnosis Date  . Cataract    Past Surgical History:  Procedure Laterality Date  . CATARACT EXTRACTION  2009/2002  . EYE SURGERY      FAMILY HISTORY Family History  Problem Relation Age of Onset  . Macular degeneration Mother   . Cataracts Mother   . Macular degeneration Sister     SOCIAL HISTORY Social History   Tobacco Use  . Smoking status: Former Research scientist (life sciences)  . Smokeless tobacco: Never Used  Substance Use Topics  . Alcohol use: Not on file  . Drug use: Not on file         OPHTHALMIC EXAM:  Base Eye Exam    Visual Acuity (Snellen - Linear)      Right Left   Dist cc 20/50 -2 20/30 -1   Dist ph cc 20/40 NI   Correction:  Glasses       Tonometry (Tonopen, 10:53 AM)      Right Left   Pressure 18 19       Pupils      Dark Light Shape React APD  Right 5 4.5 Round Minimal None   Left 5 4.5 Round Minimal None       Visual Fields (Counting fingers)      Left Right    Full Full       Extraocular Movement      Right Left    Full, Ortho Full, Ortho       Neuro/Psych    Oriented x3:  Yes   Mood/Affect:  Normal       Dilation    Both eyes:  1.0% Mydriacyl, 2.5% Phenylephrine @ 10:53 AM        Slit Lamp and Fundus Exam    Slit Lamp Exam      Right Left   Lids/Lashes Telangiectasia Telangiectasia   Conjunctiva/Sclera White and quiet White and quiet   Cornea Trace Descemet's folds, Arcus Arcus   Anterior Chamber Deep and quiet Deep and quiet   Iris Round and dilated Round and dilated   Lens PC IOL in good position, Nasal 2-3+ Posterior capsular opacification approaching visual axis PC IOL in good position, clear  PC   Vitreous Posterior vitreous detachment syneresis, Posterior vitreous detachment       Fundus Exam      Right Left   Disc Normal Normal   C/D Ratio 0.2 0.3   Macula Good foveal reflex, mild Retinal pigment epithelial mottling, No heme or edema Good foveal reflex, IRHs temporal to fovea, Exudates temporal to fovea, small flat Choroidal nevus nasal to fovea   Vessels Vascular attenuation Vascular attenuation   Periphery Attached Attached, mid-zonal drusen          IMAGING AND PROCEDURES  Imaging and Procedures for 09/29/17  OCT, Retina - OU - Both Eyes       Right Eye Quality was good. Central Foveal Thickness: 262. Progression has been stable. Findings include normal foveal contour, no IRF, no SRF, retinal drusen  (Mild ERM).   Left Eye Quality was good. Central Foveal Thickness: 313. Progression has been stable. Findings include abnormal foveal contour, retinal drusen , outer retinal atrophy, intraretinal fluid, intraretinal hyper-reflective material.   Notes *Images captured and stored on drive  Diagnosis / Impression:  OD: retinal drusen OS: focal IRF with surrounding hyper-reflective material -- temporal fovea, stable to interval increase in edema/IRF  Clinical management:  See below  Abbreviations: NFP - Normal foveal profile. CME - cystoid macular edema. PED - pigment epithelial detachment. IRF - intraretinal fluid. SRF - subretinal fluid. EZ - ellipsoid zone. ERM - epiretinal membrane. ORA - outer retinal atrophy. ORT - outer retinal tubulation. SRHM - subretinal hyper-reflective material                  ASSESSMENT/PLAN:    ICD-10-CM   1. Exudative age-related macular degeneration of left eye with active choroidal neovascularization (HCC) H35.3221 OCT, Retina - OU - Both Eyes  2. Intermediate stage nonexudative age-related macular degeneration of right eye H35.3112   3. Retinal edema H35.81   4. Pseudophakia of both eyes Z96.1     1,3. Exudative  age related macular degeneration OS.    - The incidence pathology and anatomy of wet AMD discussed   - The ANCHOR, MARINA, CATT and VIEW trials discussed with patient.    - discussed treatment options including observation vs intravitreal anti-VEGF agents such as Avastin, Lucentis, Eylea.    - Risks of endophthalmitis and vascular occlusive events and atrophic changes discussed with patient  - FA at initial visit shows mild CNV,  possible RAP lesion temporal macula  - OCT with mild interval increase in IRF OS  - recommend IVA OS today, but BCVA remains 20/30 and pt subjectively asymptomatic  - pt wishes to observe for one more visit -- reasonable  - f/u in 4-5 wks, sooner prn  2. Age related macular degeneration, non-exudative, both eyes  - The incidence, anatomy, and pathology of dry AMD, risk of progression, and the AREDS and AREDS 2 study including smoking risks discussed with patient.  - Recommend amsler grid monitoring  3. Pseudophakia OU-   - s/p CE/IOL OU  - beautiful surgery, doing well  - stable, monitor   Ophthalmic Meds Ordered this visit:  No orders of the defined types were placed in this encounter.      Return in about 5 weeks (around 11/02/2017) for F/U Exu AMD OU, Dilated exam, OCT.  There are no Patient Instructions on file for this visit.   Explained the diagnoses, plan, and follow up with the patient and they expressed understanding.  Patient expressed understanding of the importance of proper follow up care.   This document serves as a record of services personally performed by Gardiner Sleeper, MD, PhD. It was created on their behalf by Catha Brow, [place], a certified ophthalmic assistant. The creation of this record is the provider's dictation and/or activities during the visit.  Electronically signed by: Catha Brow, [place]  09/29/17 12:02 PM   Gardiner Sleeper, M.D., Ph.D. Diseases & Surgery of the Retina and [place] 09/29/17  I have reviewed the above documentation for accuracy and completeness, and I agree with the above. Gardiner Sleeper, M.D., Ph.D. 09/29/17 12:02 PM     Abbreviations: M myopia (nearsighted); A astigmatism; H hyperopia (farsighted); P presbyopia; Mrx spectacle prescription;  CTL contact lenses; OD right eye; OS left eye; OU both eyes  XT exotropia; ET esotropia; PEK punctate epithelial keratitis; PEE punctate epithelial erosions; DES dry eye syndrome; MGD meibomian gland dysfunction; ATs artificial tears; PFAT's preservative free artificial tears; [place] nuclear sclerotic cataract; PSC posterior subcapsular cataract; ERM epi-retinal membrane; PVD posterior vitreous detachment; RD retinal detachment; DM diabetes mellitus; DR diabetic retinopathy; NPDR non-proliferative diabetic retinopathy; PDR proliferative diabetic retinopathy; CSME clinically significant macular edema; DME diabetic macular edema; dbh dot blot hemorrhages; CWS cotton wool spot; POAG primary open angle glaucoma; C/D cup-to-disc ratio; HVF humphrey visual field; GVF goldmann visual field; OCT optical coherence tomography; IOP intraocular pressure; BRVO Branch retinal vein occlusion; CRVO central retinal vein occlusion; CRAO central retinal artery occlusion; BRAO branch retinal artery occlusion; RT retinal tear; SB scleral buckle; PPV pars plana vitrectomy; VH Vitreous hemorrhage; PRP panretinal laser photocoagulation; IVK intravitreal kenalog; VMT vitreomacular traction; MH Macular hole;  NVD neovascularization of the disc; NVE neovascularization elsewhere; AREDS age related eye disease study; ARMD age related macular degeneration; POAG primary open angle glaucoma; EBMD epithelial/anterior basement membrane dystrophy; ACIOL anterior chamber intraocular lens; IOL intraocular lens; PCIOL posterior chamber intraocular lens; Phaco/IOL phacoemulsification with intraocular lens placement; [place] photorefractive keratectomy; LASIK laser  assisted in situ keratomileusis; HTN hypertension; DM diabetes mellitus; COPD chronic obstructive pulmonary disease

## 2017-09-28 ENCOUNTER — Encounter (INDEPENDENT_AMBULATORY_CARE_PROVIDER_SITE_OTHER): Payer: Self-pay | Admitting: Ophthalmology

## 2017-09-28 ENCOUNTER — Ambulatory Visit (INDEPENDENT_AMBULATORY_CARE_PROVIDER_SITE_OTHER): Payer: Medicare Other | Admitting: Ophthalmology

## 2017-09-28 DIAGNOSIS — H3581 Retinal edema: Secondary | ICD-10-CM

## 2017-09-28 DIAGNOSIS — H353221 Exudative age-related macular degeneration, left eye, with active choroidal neovascularization: Secondary | ICD-10-CM | POA: Diagnosis not present

## 2017-09-28 DIAGNOSIS — Z961 Presence of intraocular lens: Secondary | ICD-10-CM | POA: Diagnosis not present

## 2017-09-28 DIAGNOSIS — H353112 Nonexudative age-related macular degeneration, right eye, intermediate dry stage: Secondary | ICD-10-CM | POA: Diagnosis not present

## 2017-09-29 ENCOUNTER — Encounter (INDEPENDENT_AMBULATORY_CARE_PROVIDER_SITE_OTHER): Payer: Self-pay | Admitting: Ophthalmology

## 2017-10-14 DIAGNOSIS — E7800 Pure hypercholesterolemia, unspecified: Secondary | ICD-10-CM | POA: Diagnosis not present

## 2017-10-14 DIAGNOSIS — M159 Polyosteoarthritis, unspecified: Secondary | ICD-10-CM | POA: Diagnosis not present

## 2017-10-14 DIAGNOSIS — I10 Essential (primary) hypertension: Secondary | ICD-10-CM | POA: Diagnosis not present

## 2017-10-31 DIAGNOSIS — Z299 Encounter for prophylactic measures, unspecified: Secondary | ICD-10-CM | POA: Diagnosis not present

## 2017-10-31 DIAGNOSIS — Z6821 Body mass index (BMI) 21.0-21.9, adult: Secondary | ICD-10-CM | POA: Diagnosis not present

## 2017-10-31 DIAGNOSIS — I10 Essential (primary) hypertension: Secondary | ICD-10-CM | POA: Diagnosis not present

## 2017-10-31 DIAGNOSIS — Z713 Dietary counseling and surveillance: Secondary | ICD-10-CM | POA: Diagnosis not present

## 2017-11-01 NOTE — Progress Notes (Signed)
[facility] Note  11/02/2017     CHIEF COMPLAINT Patient presents for Retina Follow Up   HISTORY OF PRESENT ILLNESS: Jacob Oliver is a 84 y.o. male who presents to the clinic today for:   HPI    Retina Follow Up    Patient presents with  Dry AMD.  In left eye.  This started 2 months ago.  Severity is mild.  I, the attending physician,  performed the HPI with the patient and updated documentation appropriately.          Comments    F/U  EXU AMD OU. Patient wife is at patients side today, she states patient vision is about the same no changes noted. Patient uses dry eye gtt's Prn        Last edited by Bernarda Caffey, MD on 11/02/2017 10:25 AM. (History)    Pt states he cannot tell any changes in his [place]   Referring physician: No referring provider defined for this encounter.  HISTORICAL INFORMATION:   Selected notes from the MEDICAL RECORD NUMBER Referred by Dr. Leander Rams for concern of Exudative AMD OS;  LEE- 02.21.19 ([facility]) [BCVA OD: 20/30 OS:20/20] Ocular Hx- pseudophakia OU ([facility] 10+ years ago) PMH-     CURRENT MEDICATIONS: No current outpatient medications on file. (Ophthalmic Drugs)   No current facility-administered medications for this visit.  (Ophthalmic Drugs)   Current Outpatient Medications (Other)  Medication Sig  . Calcium Carb-Ergocalciferol 500-200 MG-UNIT TABS Take by mouth.  Mariane Baumgarten Sodium 150 MG/15ML syrup Take 50 mg by mouth.  . ferrous sulfate 220 (44 Fe) MG/5ML solution Take 220 mg by mouth.  Marland Kitchen omeprazole (PRILOSEC) 20 MG capsule Take 20 mg by mouth.  . vitamin B-12 (CYANOCOBALAMIN) 500 MCG tablet Take by mouth.  . Vitamin D, Ergocalciferol, (DRISDOL) 50000 units CAPS capsule Take by mouth.   No current facility-administered medications for this visit.  (Other)      REVIEW OF SYSTEMS: ROS    Positive for: Musculoskeletal, Eyes   Negative for: Constitutional, Gastrointestinal, Neurological,  Skin, Genitourinary, HENT, Endocrine, Cardiovascular, Respiratory, Psychiatric, Allergic/Imm, Heme/Lymph   Last edited by Zenovia Jordan, LPN on 9/73/5329  9:24 AM. (History)       ALLERGIES No Known Allergies  PAST MEDICAL HISTORY Past Medical History:  Diagnosis Date  . Cataract    Past Surgical History:  Procedure Laterality Date  . CATARACT EXTRACTION  2009/2002  . EYE SURGERY      FAMILY HISTORY Family History  Problem Relation Age of Onset  . Macular degeneration Mother   . Cataracts Mother   . Macular degeneration Sister     SOCIAL HISTORY Social History   Tobacco Use  . Smoking status: Former Research scientist (life sciences)  . Smokeless tobacco: Never Used  Substance Use Topics  . Alcohol use: Not on file  . Drug use: Not on file         OPHTHALMIC EXAM:  Base Eye Exam    Visual Acuity (Snellen - Linear)      Right Left   Dist cc 20/50 20/30   Dist ph cc 20/40 NI   Correction:  Glasses       Tonometry (Tonopen, 10:05 AM)      Right Left   Pressure 18 18       Pupils      Dark Light Shape React APD   Right 4 3 Round Minimal None   Left 4 3 Round Minimal None  Visual Fields (Counting fingers)      Left Right    Full Full       Extraocular Movement      Right Left    Full, Ortho Full, Ortho       Neuro/Psych    Oriented x3:  Yes   Mood/Affect:  Normal       Dilation    Both eyes:  1.0% Mydriacyl, Paremyd @ 10:06 AM        Slit Lamp and Fundus Exam    Slit Lamp Exam      Right Left   Lids/Lashes Telangiectasia Telangiectasia   Conjunctiva/Sclera White and quiet White and quiet   Cornea Trace Descemet's folds, Arcus Arcus   Anterior Chamber Deep and quiet Deep and quiet   Iris Round and dilated Round and dilated   Lens PC IOL in good position, Nasal 2-3+ Posterior capsular opacification approaching visual axis PC IOL in good position, clear PC   Vitreous Posterior vitreous detachment syneresis, Posterior vitreous detachment       Fundus  Exam      Right Left   Disc Normal Normal   C/D Ratio 0.2 0.3   Macula Good foveal reflex, mild Retinal pigment epithelial mottling, No heme or edema Good foveal reflex, IRHs temporal to fovea, Exudates temporal to fovea, small flat Choroidal nevus nasal to fovea   Vessels Vascular attenuation Vascular attenuation   Periphery Attached Attached, mid-zonal drusen          IMAGING AND PROCEDURES  Imaging and Procedures for 09/29/17  OCT, Retina - OU - Both Eyes       Right Eye Quality was good. Central Foveal Thickness: 256. Progression has been stable. Findings include normal foveal contour, no IRF, no SRF, retinal drusen  (Mild ERM).   Left Eye Quality was good. Central Foveal Thickness: 294. Progression has improved. Findings include abnormal foveal contour, retinal drusen , outer retinal atrophy, intraretinal fluid, intraretinal hyper-reflective material (Mild interval improvement of IRF).   Notes *Images captured and stored on drive  Diagnosis / Impression:  OD: retinal drusen OS: focal IRF with surrounding hyper-reflective material -- temporal fovea, stable to interval improvement in edema/IRF  Clinical management:  See below  Abbreviations: NFP - Normal foveal profile. CME - cystoid macular edema. PED - pigment epithelial detachment. IRF - intraretinal fluid. SRF - subretinal fluid. EZ - ellipsoid zone. ERM - epiretinal membrane. ORA - outer retinal atrophy. ORT - outer retinal tubulation. SRHM - subretinal hyper-reflective material                  ASSESSMENT/PLAN:    ICD-10-CM   1. Exudative age-related macular degeneration of left eye with active choroidal neovascularization ([place]) H35.3221   2. Intermediate stage nonexudative age-related macular degeneration of right eye H35.3112   3. Retinal edema H35.81 OCT, Retina - OU - Both Eyes  4. Pseudophakia of both eyes Z96.1     1,3. Exudative age related macular degeneration OS.    - The incidence pathology  and anatomy of wet AMD discussed   - The ANCHOR, MARINA, CATT and VIEW trials discussed with patient.    - discussed treatment options including observation vs intravitreal anti-VEGF agents such as Avastin, Lucentis, Eylea.    - Risks of endophthalmitis and vascular occlusive events and atrophic changes discussed with patient  - FA at initial visit shows mild CNV, possible RAP lesion temporal macula  - OCT today stable to slightly improved IRF OS  -  but BCVA remains 20/30 and pt subjectively asymptomatic  - pt wishes to observe  - f/u in 6-8 wks, sooner prn -- repeat FA  2. Age related macular degeneration, non-exudative, both eyes  - The incidence, anatomy, and pathology of dry AMD, risk of progression, and the AREDS and AREDS 2 study including smoking risks discussed with patient.  - continue Amsler grid monitoring  3. Pseudophakia OU-   - s/p CE/IOL OU  - beautiful surgery, doing well  - stable, monitor  Ophthalmic Meds Ordered this visit:  No orders of the defined types were placed in this encounter.      Return for 6-8 weeks.  There are no Patient Instructions on file for this visit.   Explained the diagnoses, plan, and follow up with the patient and they expressed understanding.  Patient expressed understanding of the importance of proper follow up care.   This document serves as a record of services personally performed by Gardiner Sleeper, MD, PhD. It was created on their behalf by Catha Brow, [place], a certified ophthalmic assistant. The creation of this record is the provider's dictation and/or activities during the visit.  Electronically signed by: Catha Brow, COA  05.21.19 12:56 AM  This document serves as a record of services personally performed by Gardiner Sleeper, MD, PhD. It was created on their behalf by Ernest Mallick, OA, an ophthalmic assistant. The creation of this record is the provider's dictation and/or activities during the visit.    Electronically  signed by: Ernest Mallick, OA  11/02/2017 12:56 AM    Gardiner Sleeper, M.D., Ph.D. Diseases & Surgery of the Retina and Vitreous [facility] 11/02/17  I have reviewed the above documentation for accuracy and completeness, and I agree with the above. Gardiner Sleeper, M.D., Ph.D. 11/06/17 12:56 AM    Abbreviations: M myopia (nearsighted); A astigmatism; H hyperopia (farsighted); P presbyopia; Mrx spectacle prescription;  CTL contact lenses; OD right eye; OS left eye; OU both eyes  XT exotropia; ET esotropia; PEK punctate epithelial keratitis; PEE punctate epithelial erosions; DES dry eye syndrome; MGD meibomian gland dysfunction; ATs artificial tears; PFAT's preservative free artificial tears; [place] nuclear sclerotic cataract; PSC posterior subcapsular cataract; ERM epi-retinal membrane; PVD posterior vitreous detachment; RD retinal detachment; DM diabetes mellitus; DR diabetic retinopathy; NPDR non-proliferative diabetic retinopathy; PDR proliferative diabetic retinopathy; CSME clinically significant macular edema; DME diabetic macular edema; dbh dot blot hemorrhages; CWS cotton wool spot; POAG primary open angle glaucoma; C/D cup-to-disc ratio; HVF humphrey visual field; GVF goldmann visual field; OCT optical coherence tomography; IOP intraocular pressure; BRVO Branch retinal vein occlusion; CRVO central retinal vein occlusion; CRAO central retinal artery occlusion; BRAO branch retinal artery occlusion; RT retinal tear; SB scleral buckle; PPV pars plana vitrectomy; VH Vitreous hemorrhage; PRP panretinal laser photocoagulation; IVK intravitreal kenalog; VMT vitreomacular traction; MH Macular hole;  NVD neovascularization of the disc; NVE neovascularization elsewhere; AREDS age related eye disease study; ARMD age related macular degeneration; POAG primary open angle glaucoma; EBMD epithelial/anterior basement membrane dystrophy; ACIOL anterior chamber intraocular lens; IOL intraocular  lens; PCIOL posterior chamber intraocular lens; Phaco/IOL phacoemulsification with intraocular lens placement; [place] photorefractive keratectomy; LASIK laser assisted in situ keratomileusis; HTN hypertension; DM diabetes mellitus; COPD chronic obstructive pulmonary disease

## 2017-11-02 ENCOUNTER — Ambulatory Visit (INDEPENDENT_AMBULATORY_CARE_PROVIDER_SITE_OTHER): Payer: Medicare Other | Admitting: Ophthalmology

## 2017-11-02 ENCOUNTER — Encounter (INDEPENDENT_AMBULATORY_CARE_PROVIDER_SITE_OTHER): Payer: Self-pay | Admitting: Ophthalmology

## 2017-11-02 DIAGNOSIS — H353221 Exudative age-related macular degeneration, left eye, with active choroidal neovascularization: Secondary | ICD-10-CM | POA: Diagnosis not present

## 2017-11-02 DIAGNOSIS — H353112 Nonexudative age-related macular degeneration, right eye, intermediate dry stage: Secondary | ICD-10-CM

## 2017-11-02 DIAGNOSIS — H3581 Retinal edema: Secondary | ICD-10-CM | POA: Diagnosis not present

## 2017-11-02 DIAGNOSIS — Z961 Presence of intraocular lens: Secondary | ICD-10-CM

## 2017-11-06 ENCOUNTER — Encounter (INDEPENDENT_AMBULATORY_CARE_PROVIDER_SITE_OTHER): Payer: Self-pay | Admitting: Ophthalmology

## 2017-11-21 DIAGNOSIS — M159 Polyosteoarthritis, unspecified: Secondary | ICD-10-CM | POA: Diagnosis not present

## 2017-11-21 DIAGNOSIS — I10 Essential (primary) hypertension: Secondary | ICD-10-CM | POA: Diagnosis not present

## 2017-11-21 DIAGNOSIS — E7800 Pure hypercholesterolemia, unspecified: Secondary | ICD-10-CM | POA: Diagnosis not present

## 2017-12-20 NOTE — Progress Notes (Signed)
[facility] Note  12/21/2017     CHIEF COMPLAINT Patient presents for Retina Follow Up   HISTORY OF PRESENT ILLNESS: Jacob Oliver is a 84 y.o. male who presents to the clinic today for:   HPI    Retina Follow Up    Patient presents with  Wet AMD.  In left eye.  Severity is moderate.  Duration of 6 weeks.  Since onset it is stable.  I, the attending physician,  performed the HPI with the patient and updated documentation appropriately.          Comments    Pt presents for exu ARMD OS F/U, pt states VA has been fairly good since last visit, wife states he has not complained about his vision, pt denies flashes, floaters, pain and wavy vision, pt states he uses OTC gtts PRN for dryness/redness, pt states he fell down some stair while on vacation in [place] and is on several new medications, but does not know which ones,        Last edited by Bernarda Caffey, MD on 12/21/2017 11:05 AM. (History)    Pt states he cannot tell any changes in his [place]   Referring physician: No referring provider defined for this encounter.  HISTORICAL INFORMATION:   Selected notes from the MEDICAL RECORD NUMBER Referred by Dr. Leander Rams for concern of Exudative AMD OS;  LEE- 02.21.19 ([facility]) [BCVA OD: 20/30 OS:20/20] Ocular Hx- pseudophakia OU ([facility] 10+ years ago) PMH-     CURRENT MEDICATIONS: Current Outpatient Medications (Ophthalmic Drugs)  Medication Sig  . prednisoLONE acetate (PRED FORTE) 1 % ophthalmic suspension Place 1 drop into the right eye 4 (four) times daily for 7 days.   No current facility-administered medications for this visit.  (Ophthalmic Drugs)   Current Outpatient Medications (Other)  Medication Sig  . Calcium Carb-Ergocalciferol 500-200 MG-UNIT TABS Take by mouth.  Mariane Baumgarten Sodium 150 MG/15ML syrup Take 50 mg by mouth.  . ferrous sulfate 220 (44 Fe) MG/5ML solution Take 220 mg by mouth.  Marland Kitchen omeprazole (PRILOSEC) 20 MG capsule  Take 20 mg by mouth.  . vitamin B-12 (CYANOCOBALAMIN) 500 MCG tablet Take by mouth.  . Vitamin D, Ergocalciferol, (DRISDOL) 50000 units CAPS capsule Take by mouth.   No current facility-administered medications for this visit.  (Other)      REVIEW OF SYSTEMS: ROS    Positive for: Eyes   Negative for: Constitutional, Gastrointestinal, Neurological, Skin, Genitourinary, Musculoskeletal, HENT, Endocrine, Cardiovascular, Respiratory, Psychiatric, Allergic/Imm, Heme/Lymph   Last edited by Debbrah Alar, COT on 12/21/2017 10:19 AM. (History)       ALLERGIES No Known Allergies  PAST MEDICAL HISTORY Past Medical History:  Diagnosis Date  . Cataract    Past Surgical History:  Procedure Laterality Date  . CATARACT EXTRACTION  2009/2002  . EYE SURGERY      FAMILY HISTORY Family History  Problem Relation Age of Onset  . Macular degeneration Mother   . Cataracts Mother   . Macular degeneration Sister     SOCIAL HISTORY Social History   Tobacco Use  . Smoking status: Former Research scientist (life sciences)  . Smokeless tobacco: Never Used  Substance Use Topics  . Alcohol use: Not on file  . Drug use: Not on file         OPHTHALMIC EXAM:  Base Eye Exam    Visual Acuity (Snellen - Linear)      Right Left   Dist cc 20/40 -1  20/40 -2   Dist ph cc NI NI   Correction:  Glasses       Tonometry (Tonopen, 10:26 AM)      Right Left   Pressure 19 18       Pupils      Dark Light Shape React APD   Right 4 3.5 Irregular Minimal None   Left 4 3.5 Round Minimal None       Visual Fields (Counting fingers)      Left Right    Full Full       Extraocular Movement      Right Left    Full, Ortho Full, Ortho       Neuro/Psych    Oriented x3:  Yes   Mood/Affect:  Normal       Dilation    Both eyes:  1.0% Mydriacyl, 2.5% Phenylephrine @ 10:26 AM        Slit Lamp and Fundus Exam    Slit Lamp Exam      Right Left   Lids/Lashes Telangiectasia Telangiectasia   Conjunctiva/Sclera White  and quiet White and quiet   Cornea Trace Descemet's folds, Arcus Arcus   Anterior Chamber Deep and quiet Deep and quiet   Iris Round and dilated Round and dilated   Lens PC IOL in good position, Nasal 3+ Posterior capsular opacification approaching visual axis PC IOL in good position, clear PC   Vitreous Posterior vitreous detachment syneresis, Posterior vitreous detachment       Fundus Exam      Right Left   Disc Normal Normal   C/D Ratio 0.2 0.3   Macula Blunted foveal reflex, mild Retinal pigment epithelial mottling, No heme or edema Good foveal reflex, IRHs temporal to fovea, Exudates temporal to fovea, small flat Choroidal nevus nasal to fovea, pigment mottling and clumping   Vessels Vascular attenuation Vascular attenuation   Periphery Attached Attached, mid-zonal drusen, blot hemorrhage temporal periphery           IMAGING AND PROCEDURES  Imaging and Procedures for 09/29/17  OCT, Retina - OU - Both Eyes       Right Eye Quality was good. Central Foveal Thickness: 261. Progression has been stable. Findings include normal foveal contour, no IRF, no SRF, retinal drusen  (Mild ERM).   Left Eye Quality was good. Central Foveal Thickness: 359. Progression has worsened. Findings include abnormal foveal contour, retinal drusen , outer retinal atrophy, intraretinal fluid, intraretinal hyper-reflective material (Interval worsening of IRF and edema).   Notes *Images captured and stored on drive  Diagnosis / Impression:  OD: retinal drusen OS: focal IRF with surrounding hyper-reflective material -- temporal fovea, interval worsening in edema/IRF  Clinical management:  See below  Abbreviations: NFP - Normal foveal profile. CME - cystoid macular edema. PED - pigment epithelial detachment. IRF - intraretinal fluid. SRF - subretinal fluid. EZ - ellipsoid zone. ERM - epiretinal membrane. ORA - outer retinal atrophy. ORT - outer retinal tubulation. SRHM - subretinal hyper-reflective  material         Fluorescein Angiography Optos (Transit OS)       Right Eye Progression has been stable. Early phase findings include normal observations, staining. Mid/Late phase findings include normal observations, staining.   Left Eye Progression has been stable. Early phase findings include leakage, choroidal neovascularization. Mid/Late phase findings include leakage, choroidal neovascularization.   Notes *Images captured and stored on drive.   Impression:  OD: focal area of hyperfluorescent staining superior to arcades OS: focal  areas of hyperfluorescent leakage temporal to fovea -- CNV -- possible RAP lesions -- slight interval worsening of leakage       Yag Capsulotomy - OD - Right Eye       Procedure note: YAG Capsulotomy, RIGHT Eye  Informed consent obtained. Pre-op dilating drops (1% Topicamide and 2.5% Phenylephrine), and topical anesthesia given. Power: 7.5 mJ Shots: 25 Posterior capsulotomy in cruciate formation performed without difficulty. Patient tolerated procedure well. No complications. Rx pred forte 4 times a day for 7 days, then stop. Pt received written and verbal post laser education. Recheck in 1-2 weeks w/ dilated exam.                 ASSESSMENT/PLAN:    ICD-10-CM   1. Exudative age-related macular degeneration of left eye with active choroidal neovascularization (HCC) D98.3382 Fluorescein Angiography Optos (Transit OS)  2. Intermediate stage nonexudative age-related macular degeneration of right eye H35.3112   3. Retinal edema H35.81 OCT, Retina - OU - Both Eyes    Fluorescein Angiography Optos (Transit OS)  4. PCO (posterior capsular opacification), right H26.491 Yag Capsulotomy - OD - Right Eye  5. Pseudophakia of both eyes Z96.1     1,3. Exudative age related macular degeneration OS.    - The incidence pathology and anatomy of wet AMD discussed   - The ANCHOR, MARINA, CATT and VIEW trials discussed with patient.    -  discussed treatment options including observation vs intravitreal anti-VEGF agents such as Avastin, Lucentis, Eylea.    - Risks of endophthalmitis and vascular occlusive events and atrophic changes discussed with patient  - repeat FA today shows mild CNV, possible RAP lesion temporal macula -- mild interval increase in leakage  - OCT today shows slightly increased IRF OS  - BCVA OS slightly decreased to 20/40-2   - recommend IVA OS  - pt wishes to hold off today and return for IVA in 2 wks  - f/u 10 days -- will plan for IVA OS at next visit  2. Age related macular degeneration, non-exudative, both eyes  - The incidence, anatomy, and pathology of dry AMD, risk of progression, and the AREDS and AREDS 2 study including smoking risks discussed with patient.  - continue Amsler grid monitoring  3. PCO OD-  - visually significant - encroaching on central visual axis - would benefit from Yag Cap  - recommend Yag Laser OD today (07.10.19) - pt wishes to proceed - RBA of procedure discussed, questions answered - informed consent obtained and signed - see procedure note - F/U 10 days  4. Pseudophakia OU-   - s/p CE/IOL OU  - beautiful surgery, doing well  - stable, monitor  Ophthalmic Meds Ordered this visit:  Meds ordered this encounter  Medications  . prednisoLONE acetate (PRED FORTE) 1 % ophthalmic suspension    Sig: Place 1 drop into the right eye 4 (four) times daily for 7 days.    Dispense:  10 mL    Refill:  0       Return for 10-14 days - POV s/p YAG OD, Possible Injxn OS.  There are no Patient Instructions on file for this visit.   Explained the diagnoses, plan, and follow up with the patient and they expressed understanding.  Patient expressed understanding of the importance of proper follow up care.   This document serves as a record of services personally performed by Gardiner Sleeper, MD, PhD. It was created on their behalf by Ernest Mallick,  OA, an ophthalmic  assistant. The creation of this record is the provider's dictation and/or activities during the visit.    Electronically signed by: Ernest Mallick, OA 07.09.2019 11:55 AM     Gardiner Sleeper, M.D., Ph.D. Diseases & Surgery of the Retina and Vitreous [facility]   I have reviewed the above documentation for accuracy and completeness, and I agree with the above. Gardiner Sleeper, M.D., Ph.D. 12/21/17 11:55 AM       Abbreviations: M myopia (nearsighted); A astigmatism; H hyperopia (farsighted); P presbyopia; Mrx spectacle prescription;  CTL contact lenses; OD right eye; OS left eye; OU both eyes  XT exotropia; ET esotropia; PEK punctate epithelial keratitis; PEE punctate epithelial erosions; DES dry eye syndrome; MGD meibomian gland dysfunction; ATs artificial tears; PFAT's preservative free artificial tears; [place] nuclear sclerotic cataract; PSC posterior subcapsular cataract; ERM epi-retinal membrane; PVD posterior vitreous detachment; RD retinal detachment; DM diabetes mellitus; DR diabetic retinopathy; NPDR non-proliferative diabetic retinopathy; PDR proliferative diabetic retinopathy; CSME clinically significant macular edema; DME diabetic macular edema; dbh dot blot hemorrhages; CWS cotton wool spot; POAG primary open angle glaucoma; C/D cup-to-disc ratio; HVF humphrey visual field; GVF goldmann visual field; OCT optical coherence tomography; IOP intraocular pressure; BRVO Branch retinal vein occlusion; CRVO central retinal vein occlusion; CRAO central retinal artery occlusion; BRAO branch retinal artery occlusion; RT retinal tear; SB scleral buckle; PPV pars plana vitrectomy; VH Vitreous hemorrhage; PRP panretinal laser photocoagulation; IVK intravitreal kenalog; VMT vitreomacular traction; MH Macular hole;  NVD neovascularization of the disc; NVE neovascularization elsewhere; AREDS age related eye disease study; ARMD age related macular degeneration; POAG primary open angle  glaucoma; EBMD epithelial/anterior basement membrane dystrophy; ACIOL anterior chamber intraocular lens; IOL intraocular lens; PCIOL posterior chamber intraocular lens; Phaco/IOL phacoemulsification with intraocular lens placement; [place] photorefractive keratectomy; LASIK laser assisted in situ keratomileusis; HTN hypertension; DM diabetes mellitus; COPD chronic obstructive pulmonary disease

## 2017-12-21 ENCOUNTER — Encounter (INDEPENDENT_AMBULATORY_CARE_PROVIDER_SITE_OTHER): Payer: Self-pay | Admitting: Ophthalmology

## 2017-12-21 ENCOUNTER — Ambulatory Visit (INDEPENDENT_AMBULATORY_CARE_PROVIDER_SITE_OTHER): Payer: Medicare Other | Admitting: Ophthalmology

## 2017-12-21 DIAGNOSIS — H353112 Nonexudative age-related macular degeneration, right eye, intermediate dry stage: Secondary | ICD-10-CM

## 2017-12-21 DIAGNOSIS — H353221 Exudative age-related macular degeneration, left eye, with active choroidal neovascularization: Secondary | ICD-10-CM | POA: Diagnosis not present

## 2017-12-21 DIAGNOSIS — Z961 Presence of intraocular lens: Secondary | ICD-10-CM | POA: Diagnosis not present

## 2017-12-21 DIAGNOSIS — H3581 Retinal edema: Secondary | ICD-10-CM

## 2017-12-21 DIAGNOSIS — H26491 Other secondary cataract, right eye: Secondary | ICD-10-CM

## 2017-12-21 MED ORDER — PREDNISOLONE ACETATE 1 % OP SUSP: 1.0000 [drp] | Freq: Four times a day (QID) | OPHTHALMIC | 0 refills | Status: AC

## 2018-01-02 NOTE — Progress Notes (Signed)
[facility] Note  01/03/2018     CHIEF COMPLAINT Patient presents for Retina Follow Up   HISTORY OF PRESENT ILLNESS: Jacob Oliver is a 84 y.o. male who presents to the clinic today for:   HPI    Retina Follow Up    Patient presents with  Other.  In both eyes.  Duration of 2 weeks.  Since onset it is gradually improving.  I, the attending physician,  performed the HPI with the patient and updated documentation appropriately.          Comments    Patient states vision improved since yag laser OD (12/21/2017).  Used gtts as instructed OD for 7 days following laser. Vision seems about the same OS.        Last edited by Bernarda Caffey, MD on 01/03/2018 11:37 AM. (History)    Pt states he feels OD VA has improved since having Yag laser:   Referring physician: No referring provider defined for this encounter.  HISTORICAL INFORMATION:   Selected notes from the MEDICAL RECORD NUMBER Referred by Dr. Leander Rams for concern of Exudative AMD OS;  LEE- 02.21.19 ([facility]. [facility]) [BCVA OD: 20/30 OS:20/20] Ocular Hx- pseudophakia OU ([facility] 10+ years ago) PMH-     CURRENT MEDICATIONS: No current outpatient medications on file. (Ophthalmic Drugs)   No current facility-administered medications for this visit.  (Ophthalmic Drugs)   Current Outpatient Medications (Other)  Medication Sig  . Calcium Carb-Ergocalciferol 500-200 MG-UNIT TABS Take by mouth.  Mariane Baumgarten Sodium 150 MG/15ML syrup Take 50 mg by mouth.  . ferrous sulfate 220 (44 Fe) MG/5ML solution Take 220 mg by mouth.  Marland Kitchen omeprazole (PRILOSEC) 20 MG capsule Take 20 mg by mouth.  . vitamin B-12 (CYANOCOBALAMIN) 500 MCG tablet Take by mouth.  . Vitamin D, Ergocalciferol, (DRISDOL) 50000 units CAPS capsule Take by mouth.   Current Facility-Administered Medications (Other)  Medication Route  . Bevacizumab (AVASTIN) SOLN 1.25 mg Intravitreal      REVIEW OF SYSTEMS: ROS    Positive for:  Musculoskeletal, Eyes   Negative for: Constitutional, Gastrointestinal, Neurological, Skin, Genitourinary, HENT, Endocrine, Cardiovascular, Respiratory, Psychiatric, Allergic/Imm, Heme/Lymph   Last edited by Roselee Nova D on 01/03/2018 10:25 AM. (History)       ALLERGIES No Known Allergies  PAST MEDICAL HISTORY Past Medical History:  Diagnosis Date  . Cataract    Past Surgical History:  Procedure Laterality Date  . CATARACT EXTRACTION  2009/2002  . EYE SURGERY      FAMILY HISTORY Family History  Problem Relation Age of Onset  . Macular degeneration Mother   . Cataracts Mother   . Macular degeneration Sister     SOCIAL HISTORY Social History   Tobacco Use  . Smoking status: Former Research scientist (life sciences)  . Smokeless tobacco: Never Used  Substance Use Topics  . Alcohol use: Not on file  . Drug use: Not on file         OPHTHALMIC EXAM:  Base Eye Exam    Visual Acuity (Snellen - Linear)      Right Left   Dist cc 20/40 -1 20/40   Dist ph cc NI NI   Correction:  Glasses       Tonometry (Tonopen, 10:41 AM)      Right Left   Pressure 18 17       Pupils      Dark Light Shape React APD   Right 4 3.5 Round Minimal None  Left 4 3.5 Irregular Minimal None       Visual Fields (Counting fingers)      Left Right    Full Full       Extraocular Movement      Right Left    Full, Ortho Full, Ortho       Neuro/Psych    Oriented x3:  Yes   Mood/Affect:  Normal       Dilation    Both eyes:  1.0% Mydriacyl, 2.5% Phenylephrine @ 10:41 AM        Slit Lamp and Fundus Exam    Slit Lamp Exam      Right Left   Lids/Lashes Telangiectasia Telangiectasia   Conjunctiva/Sclera White and quiet White and quiet   Cornea Trace Descemet's folds, Arcus Arcus   Anterior Chamber Deep and quiet Deep and quiet   Iris Round and dilated Round and dilated   Lens PC IOL in good position, open PC PC IOL in good position, clear PC   Vitreous Posterior vitreous detachment syneresis,  Posterior vitreous detachment       Fundus Exam      Right Left   Disc Normal Normal   C/D Ratio 0.2 0.3   Macula Blunted foveal reflex, mild Retinal pigment epithelial mottling, No heme or edema Good foveal reflex, IRHs temporal to fovea, Exudates temporal to fovea, small flat Choroidal nevus nasal to fovea, pigment mottling and clumping, +SRF   Vessels Vascular attenuation Vascular attenuation   Periphery Attached Attached, mid-zonal drusen, blot hemorrhage temporal periphery         Refraction    Wearing Rx      Sphere Cylinder Axis   Right [place] +1.25 007   Left -1.50 +2.00 177       Manifest Refraction (Over)      Sphere Cylinder Axis Dist VA   Right [place] +1.25 158 20/40-2   Left -1.50 +2.00 175 20/30          IMAGING AND PROCEDURES  Imaging and Procedures for 09/29/17  OCT, Retina - OU - Both Eyes       Right Eye Quality was good. Central Foveal Thickness: 257. Progression has been stable. Findings include normal foveal contour, no IRF, no SRF, retinal drusen  (Mild ERM).   Left Eye Quality was good. Central Foveal Thickness: 326. Progression has worsened. Findings include abnormal foveal contour, retinal drusen , outer retinal atrophy, intraretinal fluid, intraretinal hyper-reflective material, subretinal fluid (Interval development of SRF, and persistent IRF/IRHM).   Notes *Images captured and stored on drive  Diagnosis / Impression:  OD: retinal drusen OS: Interval development of SRF, and persistent IRF/IRHM  Clinical management:  See below  Abbreviations: NFP - Normal foveal profile. CME - cystoid macular edema. PED - pigment epithelial detachment. IRF - intraretinal fluid. SRF - subretinal fluid. EZ - ellipsoid zone. ERM - epiretinal membrane. ORA - outer retinal atrophy. ORT - outer retinal tubulation. SRHM - subretinal hyper-reflective material         Intravitreal Injection, Pharmacologic Agent - OS - Left Eye       Time Out 01/03/2018.  11:15 AM. Confirmed correct patient, procedure, site, and patient consented.   Anesthesia Topical anesthesia was used. Anesthetic medications included Tetracaine 0.5%, Lidocaine 2%.   Procedure Preparation included 5% betadine to ocular surface. A 30 gauge needle was used.   Injection: 1.25 mg Bevacizumab 1.25mg /0.40ml   NDC: 17793-903-00    Lot: [id number]     Expiration Date: 03/16/2018  Route: Intravitreal   Site: Left Eye   Waste: 0 mL  Post-op Post injection exam found visual acuity of at least counting fingers. The patient tolerated the procedure well. There were no complications. The patient received written and verbal post procedure care education.                 ASSESSMENT/PLAN:    ICD-10-CM   1. Exudative age-related macular degeneration of left eye with active choroidal neovascularization (HCC) H35.3221 OCT, Retina - OU - Both Eyes    Intravitreal Injection, Pharmacologic Agent - OS - Left Eye    Bevacizumab (AVASTIN) SOLN 1.25 mg  2. Intermediate stage nonexudative age-related macular degeneration of right eye H35.3112   3. Retinal edema H35.81 OCT, Retina - OU - Both Eyes  4. PCO (posterior capsular opacification), right H26.491   5. Pseudophakia of both eyes Z96.1     1,3. Exudative age related macular degeneration OS.    - The incidence pathology and anatomy of wet AMD discussed   - The ANCHOR, MARINA, CATT and VIEW trials discussed with patient.    - discussed treatment options including observation vs intravitreal anti-VEGF agents such as Avastin, Lucentis, Eylea.    - Risks of endophthalmitis and vascular occlusive events and atrophic changes discussed with patient  - most recent FA shows mild CNV, possible RAP lesion temporal macula -- mild interval increase in leakage  - OCT today shows slightly increased IRF OS  - BCVA OS 20/40  - recommend IVA OS #1 today (07.23.19)  - RBA of procedure discussed, questions answered  - informed consent obtained  and signed  - see procedure note  - f/u 4 weeks, DFE, OCT, possible injxn  2. Age related macular degeneration, non-exudative, both eyes  - The incidence, anatomy, and pathology of dry AMD, risk of progression, and the AREDS and AREDS 2 study including smoking risks discussed with patient.  - continue Amsler grid monitoring  3. PCO OD-  - S/P Yag Laser OD (07.10.19) - excellent opening in capsule - pt reports subjective improvement in vision OD - VA stable at 20/40  4. Pseudophakia OU-   - s/p CE/IOL OU  - beautiful surgery, doing well  - stable, monitor  Ophthalmic Meds Ordered this visit:  Meds ordered this encounter  Medications  . Bevacizumab (AVASTIN) SOLN 1.25 mg       Return in about 1 month (around 01/31/2018) for F/U Exu AMD OS, DFE, OCT.  There are no Patient Instructions on file for this visit.   Explained the diagnoses, plan, and follow up with the patient and they expressed understanding.  Patient expressed understanding of the importance of proper follow up care.   This document serves as a record of services personally performed by Gardiner Sleeper, MD, PhD. It was created on their behalf by Catha Brow, [place], a certified ophthalmic assistant. The creation of this record is the provider's dictation and/or activities during the visit.  Electronically signed by: Catha Brow, COA  07.22.19 1:01 PM   Gardiner Sleeper, M.D., Ph.D. Diseases & Surgery of the Retina and Vitreous [facility]   I have reviewed the above documentation for accuracy and completeness, and I agree with the above. Gardiner Sleeper, M.D., Ph.D. 01/03/18 1:01 PM    Abbreviations: M myopia (nearsighted); A astigmatism; H hyperopia (farsighted); P presbyopia; Mrx spectacle prescription;  CTL contact lenses; OD right eye; OS left eye; OU both eyes  XT exotropia; ET  esotropia; PEK punctate epithelial keratitis; PEE punctate epithelial erosions; DES dry eye syndrome;  MGD meibomian gland dysfunction; ATs artificial tears; PFAT's preservative free artificial tears; [place] nuclear sclerotic cataract; PSC posterior subcapsular cataract; ERM epi-retinal membrane; PVD posterior vitreous detachment; RD retinal detachment; DM diabetes mellitus; DR diabetic retinopathy; NPDR non-proliferative diabetic retinopathy; PDR proliferative diabetic retinopathy; CSME clinically significant macular edema; DME diabetic macular edema; dbh dot blot hemorrhages; CWS cotton wool spot; POAG primary open angle glaucoma; C/D cup-to-disc ratio; HVF humphrey visual field; GVF goldmann visual field; OCT optical coherence tomography; IOP intraocular pressure; BRVO Branch retinal vein occlusion; CRVO central retinal vein occlusion; CRAO central retinal artery occlusion; BRAO branch retinal artery occlusion; RT retinal tear; SB scleral buckle; PPV pars plana vitrectomy; VH Vitreous hemorrhage; PRP panretinal laser photocoagulation; IVK intravitreal kenalog; VMT vitreomacular traction; MH Macular hole;  NVD neovascularization of the disc; NVE neovascularization elsewhere; AREDS age related eye disease study; ARMD age related macular degeneration; POAG primary open angle glaucoma; EBMD epithelial/anterior basement membrane dystrophy; ACIOL anterior chamber intraocular lens; IOL intraocular lens; PCIOL posterior chamber intraocular lens; Phaco/IOL phacoemulsification with intraocular lens placement; [place] photorefractive keratectomy; LASIK laser assisted in situ keratomileusis; HTN hypertension; DM diabetes mellitus; COPD chronic obstructive pulmonary disease

## 2018-01-03 ENCOUNTER — Ambulatory Visit (INDEPENDENT_AMBULATORY_CARE_PROVIDER_SITE_OTHER): Payer: Medicare Other | Admitting: Ophthalmology

## 2018-01-03 ENCOUNTER — Encounter (INDEPENDENT_AMBULATORY_CARE_PROVIDER_SITE_OTHER): Payer: Self-pay | Admitting: Ophthalmology

## 2018-01-03 DIAGNOSIS — H26491 Other secondary cataract, right eye: Secondary | ICD-10-CM

## 2018-01-03 DIAGNOSIS — H353112 Nonexudative age-related macular degeneration, right eye, intermediate dry stage: Secondary | ICD-10-CM

## 2018-01-03 DIAGNOSIS — H3581 Retinal edema: Secondary | ICD-10-CM

## 2018-01-03 DIAGNOSIS — Z961 Presence of intraocular lens: Secondary | ICD-10-CM

## 2018-01-03 DIAGNOSIS — H353221 Exudative age-related macular degeneration, left eye, with active choroidal neovascularization: Secondary | ICD-10-CM | POA: Diagnosis not present

## 2018-01-03 MED ORDER — BEVACIZUMAB CHEMO INJECTION 1.25MG/0.05ML SYRINGE FOR KALEIDOSCOPE
1.2500 mg | INTRAVITREAL | Status: AC
  Administered 2018-01-03: 1.25 mg via INTRAVITREAL

## 2018-01-10 DIAGNOSIS — M159 Polyosteoarthritis, unspecified: Secondary | ICD-10-CM | POA: Diagnosis not present

## 2018-01-10 DIAGNOSIS — E7800 Pure hypercholesterolemia, unspecified: Secondary | ICD-10-CM | POA: Diagnosis not present

## 2018-01-10 DIAGNOSIS — I10 Essential (primary) hypertension: Secondary | ICD-10-CM | POA: Diagnosis not present

## 2018-01-30 NOTE — Progress Notes (Signed)
[facility] Note  01/31/2018     CHIEF COMPLAINT Patient presents for Retina Follow Up   HISTORY OF PRESENT ILLNESS: Jacob Oliver is a 84 y.o. male who presents to the clinic today for:   HPI    Retina Follow Up    Patient presents with  Wet AMD.  In left eye.  Severity is mild.  Since onset it is gradually improving.  I, the attending physician,  performed the HPI with the patient and updated documentation appropriately.          Comments    F/U EXU AMD OS. Patient states "My vision is good". Denies flashes, floaters and ocular pain. Patient is taking PreserVision qam and multi vitamins Qd and using artifical tears PRN. Patient ready for Tx if indicated.       Last edited by Bernarda Caffey, MD on 01/31/2018 10:54 AM. (History)       Referring physician: No referring provider defined for this encounter.  HISTORICAL INFORMATION:   Selected notes from the MEDICAL RECORD NUMBER Referred by Dr. Leander Rams for concern of Exudative AMD OS;  LEE- 02.21.19 ([facility]) [BCVA OD: 20/30 OS:20/20] Ocular Hx- pseudophakia OU ([facility] 10+ years ago) PMH-     CURRENT MEDICATIONS: No current outpatient medications on file. (Ophthalmic Drugs)   No current facility-administered medications for this visit.  (Ophthalmic Drugs)   Current Outpatient Medications (Other)  Medication Sig  . Calcium Carb-Ergocalciferol 500-200 MG-UNIT TABS Take by mouth.  Mariane Baumgarten Sodium 150 MG/15ML syrup Take 50 mg by mouth.  . ferrous sulfate 220 (44 Fe) MG/5ML solution Take 220 mg by mouth.  Marland Kitchen omeprazole (PRILOSEC) 20 MG capsule Take 20 mg by mouth.  . vitamin B-12 (CYANOCOBALAMIN) 500 MCG tablet Take by mouth.  . Vitamin D, Ergocalciferol, (DRISDOL) 50000 units CAPS capsule Take by mouth.   Current Facility-Administered Medications (Other)  Medication Route  . Bevacizumab (AVASTIN) SOLN 1.25 mg Intravitreal  . Bevacizumab (AVASTIN) SOLN 1.25 mg Intravitreal       REVIEW OF SYSTEMS: ROS    Positive for: Eyes   Negative for: Constitutional, Gastrointestinal, Neurological, Skin, Genitourinary, Musculoskeletal, HENT, Endocrine, Cardiovascular, Respiratory, Psychiatric, Allergic/Imm, Heme/Lymph   Last edited by Zenovia Jordan, LPN on 4/68/0321 22:48 AM. (History)       ALLERGIES No Known Allergies  PAST MEDICAL HISTORY Past Medical History:  Diagnosis Date  . Cataract    Past Surgical History:  Procedure Laterality Date  . CATARACT EXTRACTION  2009/2002  . EYE SURGERY      FAMILY HISTORY Family History  Problem Relation Age of Onset  . Macular degeneration Mother   . Cataracts Mother   . Macular degeneration Sister     SOCIAL HISTORY Social History   Tobacco Use  . Smoking status: Former Research scientist (life sciences)  . Smokeless tobacco: Never Used  Substance Use Topics  . Alcohol use: Not on file  . Drug use: Not on file         OPHTHALMIC EXAM:  Base Eye Exam    Visual Acuity (Snellen - Linear)      Right Left   Dist cc 20/40 20/40   Dist ph cc NI NI   Correction:  Glasses       Tonometry (Tonopen, 10:33 AM)      Right Left   Pressure 16 17       Pupils      Dark Light Shape React APD   Right 4 3  Round Sluggish None   Left 4 3 Irregular Sluggish None       Visual Fields (Counting fingers)      Left Right    Full Full       Extraocular Movement      Right Left    Full, Ortho Full, Ortho       Neuro/Psych    Oriented x3:  Yes   Mood/Affect:  Normal       Dilation    Both eyes:  1.0% Mydriacyl, 2.5% Phenylephrine @ 10:33 AM        Slit Lamp and Fundus Exam    Slit Lamp Exam      Right Left   Lids/Lashes Telangiectasia Telangiectasia   Conjunctiva/Sclera White and quiet White and quiet   Cornea Trace Descemet's folds, Arcus Arcus   Anterior Chamber Deep and quiet Deep and quiet   Iris Round and dilated Round and dilated   Lens PC IOL in good position, open PC PC IOL in good position, clear PC    Vitreous Posterior vitreous detachment syneresis, Posterior vitreous detachment       Fundus Exam      Right Left   Disc Normal Normal   C/D Ratio 0.2 0.3   Macula Blunted foveal reflex, mild Retinal pigment epithelial mottling, No heme or edema Good foveal reflex, IRHs temporal to fovea, Exudates temporal to fovea, small flat Choroidal nevus nasal to fovea, pigment mottling and clumping, +SRF -- improved, +cystic changes, Drusen   Vessels Vascular attenuation Vascular attenuation   Periphery Attached Attached, mid-zonal drusen, blot hemorrhage temporal periphery           IMAGING AND PROCEDURES  Imaging and Procedures for 09/29/17  OCT, Retina - OU - Both Eyes       Right Eye Quality was good. Central Foveal Thickness: 261. Progression has been stable. Findings include normal foveal contour, no IRF, no SRF, retinal drusen , epiretinal membrane (Mild ERM).   Left Eye Quality was good. Central Foveal Thickness: 283. Progression has improved. Findings include abnormal foveal contour, retinal drusen , outer retinal atrophy, intraretinal fluid, intraretinal hyper-reflective material, no SRF (Interval development of SRF, and persistent IRF/IRHM).   Notes *Images captured and stored on drive  Diagnosis / Impression:  OD: retinal drusen OS: exudative ARMD: Interval improvement in IRF/SRF  Clinical management:  See below  Abbreviations: NFP - Normal foveal profile. CME - cystoid macular edema. PED - pigment epithelial detachment. IRF - intraretinal fluid. SRF - subretinal fluid. EZ - ellipsoid zone. ERM - epiretinal membrane. ORA - outer retinal atrophy. ORT - outer retinal tubulation. SRHM - subretinal hyper-reflective material         Intravitreal Injection, Pharmacologic Agent - OS - Left Eye       Time Out 01/31/2018. 11:37 AM. Confirmed correct patient, procedure, site, and patient consented.   Anesthesia Topical anesthesia was used. Anesthetic medications included  Tetracaine 0.5%, Lidocaine 2%.   Procedure Preparation included 5% betadine to ocular surface, eyelid speculum. A supplied needle was used.   Injection:  1.25 mg Bevacizumab 1.25mg /0.98ml   NDC: 50242-060-01, Lot: [id number] , Expiration date: 02/15/2018   Route: Intravitreal, Site: Left Eye, Waste: 0 mL  Post-op Post injection exam found visual acuity of at least counting fingers. The patient tolerated the procedure well. There were no complications. The patient received written and verbal post procedure care education.                 ASSESSMENT/PLAN:  ICD-10-CM   1. Exudative age-related macular degeneration of left eye with active choroidal neovascularization (HCC) H35.3221 OCT, Retina - OU - Both Eyes    Intravitreal Injection, Pharmacologic Agent - OS - Left Eye    Bevacizumab (AVASTIN) SOLN 1.25 mg  2. Intermediate stage nonexudative age-related macular degeneration of right eye H35.3112   3. Retinal edema H35.81 OCT, Retina - OU - Both Eyes  4. PCO (posterior capsular opacification), right H26.491   5. Pseudophakia of both eyes Z96.1     1,3. Exudative age related macular degeneration OS.    - most recent FA shows mild CNV, possible RAP lesion temporal macula -- mild interval increase in leakage  - S/P IVA OS #1 (07.23.19)  - OCT today shows slightly increased IRF OS  - BCVA OS remains 20/40, but pt reports subjective improvement in vision OS  - recommend IVA OS #2 today (08.20.19)  - RBA of procedure discussed, questions answered  - informed consent obtained and signed  - see procedure note  - f/u 4 weeks, DFE, OCT, possible injxn  2. Age related macular degeneration, non-exudative, right eye  - The incidence, anatomy, and pathology of dry AMD, risk of progression, and the AREDS and AREDS 2 study including smoking risks discussed with patient.  - continue Amsler grid monitoring  4. PCO OD-  - S/P Yag Laser OD (07.10.19) - excellent opening in capsule - pt  reports subjective improvement in vision OD - VA stable at 20/40  5. Pseudophakia OU-   - s/p CE/IOL OU  - beautiful surgeries, doing well  - stable, monitor   Ophthalmic Meds Ordered this visit:  Meds ordered this encounter  Medications  . Bevacizumab (AVASTIN) SOLN 1.25 mg       Return in about 4 weeks (around 02/28/2018) for F/U Exu AMD OS, DFE, OCT.  There are no Patient Instructions on file for this visit.   Explained the diagnoses, plan, and follow up with the patient and they expressed understanding.  Patient expressed understanding of the importance of proper follow up care.     Gardiner Sleeper, M.D., Ph.D. Diseases & Surgery of the Retina and Vitreous [facility]   I have reviewed the above documentation for accuracy and completeness, and I agree with the above. Gardiner Sleeper, M.D., Ph.D. 01/31/18 11:57 AM   Abbreviations: M myopia (nearsighted); A astigmatism; H hyperopia (farsighted); P presbyopia; Mrx spectacle prescription;  CTL contact lenses; OD right eye; OS left eye; OU both eyes  XT exotropia; ET esotropia; PEK punctate epithelial keratitis; PEE punctate epithelial erosions; DES dry eye syndrome; MGD meibomian gland dysfunction; ATs artificial tears; PFAT's preservative free artificial tears; [place] nuclear sclerotic cataract; PSC posterior subcapsular cataract; ERM epi-retinal membrane; PVD posterior vitreous detachment; RD retinal detachment; DM diabetes mellitus; DR diabetic retinopathy; NPDR non-proliferative diabetic retinopathy; PDR proliferative diabetic retinopathy; CSME clinically significant macular edema; DME diabetic macular edema; dbh dot blot hemorrhages; CWS cotton wool spot; POAG primary open angle glaucoma; C/D cup-to-disc ratio; HVF humphrey visual field; GVF goldmann visual field; OCT optical coherence tomography; IOP intraocular pressure; BRVO Branch retinal vein occlusion; CRVO central retinal vein occlusion; CRAO central  retinal artery occlusion; BRAO branch retinal artery occlusion; RT retinal tear; SB scleral buckle; PPV pars plana vitrectomy; VH Vitreous hemorrhage; PRP panretinal laser photocoagulation; IVK intravitreal kenalog; VMT vitreomacular traction; MH Macular hole;  NVD neovascularization of the disc; NVE neovascularization elsewhere; AREDS age related eye disease study; ARMD age related macular  degeneration; POAG primary open angle glaucoma; EBMD epithelial/anterior basement membrane dystrophy; ACIOL anterior chamber intraocular lens; IOL intraocular lens; PCIOL posterior chamber intraocular lens; Phaco/IOL phacoemulsification with intraocular lens placement; [place] photorefractive keratectomy; LASIK laser assisted in situ keratomileusis; HTN hypertension; DM diabetes mellitus; COPD chronic obstructive pulmonary disease

## 2018-01-31 ENCOUNTER — Ambulatory Visit (INDEPENDENT_AMBULATORY_CARE_PROVIDER_SITE_OTHER): Payer: Medicare Other | Admitting: Ophthalmology

## 2018-01-31 ENCOUNTER — Encounter (INDEPENDENT_AMBULATORY_CARE_PROVIDER_SITE_OTHER): Payer: Self-pay | Admitting: Ophthalmology

## 2018-01-31 DIAGNOSIS — H353112 Nonexudative age-related macular degeneration, right eye, intermediate dry stage: Secondary | ICD-10-CM

## 2018-01-31 DIAGNOSIS — H26491 Other secondary cataract, right eye: Secondary | ICD-10-CM

## 2018-01-31 DIAGNOSIS — H3581 Retinal edema: Secondary | ICD-10-CM | POA: Diagnosis not present

## 2018-01-31 DIAGNOSIS — Z961 Presence of intraocular lens: Secondary | ICD-10-CM

## 2018-01-31 DIAGNOSIS — H353221 Exudative age-related macular degeneration, left eye, with active choroidal neovascularization: Secondary | ICD-10-CM

## 2018-01-31 MED ORDER — BEVACIZUMAB CHEMO INJECTION 1.25MG/0.05ML SYRINGE FOR KALEIDOSCOPE
1.2500 mg | INTRAVITREAL | Status: AC
  Administered 2018-01-31: 1.25 mg via INTRAVITREAL

## 2018-02-02 DIAGNOSIS — I10 Essential (primary) hypertension: Secondary | ICD-10-CM | POA: Diagnosis not present

## 2018-02-02 DIAGNOSIS — E785 Hyperlipidemia, unspecified: Secondary | ICD-10-CM | POA: Diagnosis not present

## 2018-02-02 DIAGNOSIS — Z6821 Body mass index (BMI) 21.0-21.9, adult: Secondary | ICD-10-CM | POA: Diagnosis not present

## 2018-02-02 DIAGNOSIS — Z299 Encounter for prophylactic measures, unspecified: Secondary | ICD-10-CM | POA: Diagnosis not present

## 2018-02-02 DIAGNOSIS — H6120 Impacted cerumen, unspecified ear: Secondary | ICD-10-CM | POA: Diagnosis not present

## 2018-02-27 NOTE — Progress Notes (Signed)
[facility] Note  02/28/2018     CHIEF COMPLAINT Patient presents for Retina Follow Up   HISTORY OF PRESENT ILLNESS: Jacob Oliver is a 84 y.o. male who presents to the clinic today for:   HPI    Retina Follow Up    Patient presents with  Wet AMD.  In both eyes.  Severity is moderate.  Duration of 4 weeks.  Since onset it is stable.  I, the attending physician,  performed the HPI with the patient and updated documentation appropriately.          Comments    Pt presents for exu ARMD OU f/u, pt states after injections it takes awhile for his vision to get back to where it doesn't bother him, pt states once in awhile he sees floaters, but not as many as he used to, he denies FOL, pain or wavy vision, pt used OTC gtts for dryness every once in awhile       Last edited by Bernarda Caffey, MD on 02/28/2018 11:04 AM. (History)       Referring physician: No referring provider defined for this encounter.  HISTORICAL INFORMATION:   Selected notes from the MEDICAL RECORD NUMBER Referred by Dr. Leander Rams for concern of Exudative AMD OS;  LEE- 02.21.19 ([facility]) [BCVA OD: 20/30 OS:20/20] Ocular Hx- pseudophakia OU ([facility] 10+ years ago) PMH-     CURRENT MEDICATIONS: No current outpatient medications on file. (Ophthalmic Drugs)   No current facility-administered medications for this visit.  (Ophthalmic Drugs)   Current Outpatient Medications (Other)  Medication Sig  . Calcium Carb-Ergocalciferol 500-200 MG-UNIT TABS Take by mouth.  Mariane Baumgarten Sodium 150 MG/15ML syrup Take 50 mg by mouth.  . ferrous sulfate 220 (44 Fe) MG/5ML solution Take 220 mg by mouth.  Marland Kitchen omeprazole (PRILOSEC) 20 MG capsule Take 20 mg by mouth.  . vitamin B-12 (CYANOCOBALAMIN) 500 MCG tablet Take by mouth.  . Vitamin D, Ergocalciferol, (DRISDOL) 50000 units CAPS capsule Take by mouth.   Current Facility-Administered Medications (Other)  Medication Route  . Bevacizumab  (AVASTIN) SOLN 1.25 mg Intravitreal  . Bevacizumab (AVASTIN) SOLN 1.25 mg Intravitreal  . Bevacizumab (AVASTIN) SOLN 1.25 mg Intravitreal      REVIEW OF SYSTEMS: ROS    Positive for: HENT, Eyes   Negative for: Constitutional, Gastrointestinal, Neurological, Skin, Genitourinary, Musculoskeletal, Endocrine, Cardiovascular, Respiratory, Psychiatric, Allergic/Imm, Heme/Lymph   Last edited by Debbrah Alar, COT on 02/28/2018 10:28 AM. (History)       ALLERGIES No Known Allergies  PAST MEDICAL HISTORY Past Medical History:  Diagnosis Date  . Cataract    Past Surgical History:  Procedure Laterality Date  . CATARACT EXTRACTION  2009/2002  . EYE SURGERY      FAMILY HISTORY Family History  Problem Relation Age of Onset  . Macular degeneration Mother   . Cataracts Mother   . Macular degeneration Sister     SOCIAL HISTORY Social History   Tobacco Use  . Smoking status: Former Research scientist (life sciences)  . Smokeless tobacco: Never Used  Substance Use Topics  . Alcohol use: Not on file  . Drug use: Not on file         OPHTHALMIC EXAM:  Base Eye Exam    Visual Acuity (Snellen - Linear)      Right Left   Dist cc 20/40 20/40   Dist ph cc 20/40 +1 20/25 -1   Correction:  Glasses  Tonometry (Tonopen, 10:35 AM)      Right Left   Pressure 18 17       Pupils      Dark Light Shape React APD   Right 4 3.5 Round Sluggish None   Left 4 3.5 Irregular Sluggish None       Visual Fields (Counting fingers)      Left Right    Full Full       Extraocular Movement      Right Left    Full, Ortho Full, Ortho       Neuro/Psych    Oriented x3:  Yes   Mood/Affect:  Normal       Dilation    Both eyes:  1.0% Mydriacyl, 2.5% Phenylephrine @ 10:36 AM        Slit Lamp and Fundus Exam    Slit Lamp Exam      Right Left   Lids/Lashes Telangiectasia Telangiectasia   Conjunctiva/Sclera White and quiet White and quiet   Cornea Trace Descemet's folds, Arcus Arcus   Anterior Chamber  Deep and quiet Deep and quiet   Iris Round and dilated Round and dilated   Lens PC IOL in good position, open PC PC IOL in good position, clear PC   Vitreous Posterior vitreous detachment syneresis, Posterior vitreous detachment       Fundus Exam      Right Left   Disc Normal Normal   C/D Ratio 0.2 0.3   Macula Blunted foveal reflex, mild Retinal pigment epithelial mottling, No heme or edema Good foveal reflex, IRHs temporal to fovea, Exudates temporal to fovea, small flat Choroidal nevus nasal to fovea, pigment mottling and clumping, interval improvement in cystic change, Drusen   Vessels Vascular attenuation Vascular attenuation   Periphery Attached Attached, mid-zonal drusen, blot hemorrhage temporal periphery           IMAGING AND PROCEDURES  Imaging and Procedures for 09/29/17  OCT, Retina - OU - Both Eyes       Right Eye Quality was good. Central Foveal Thickness: 257. Progression has been stable. Findings include normal foveal contour, no IRF, no SRF, retinal drusen , epiretinal membrane (Mild ERM).   Left Eye Quality was good. Central Foveal Thickness: 271. Progression has improved. Findings include abnormal foveal contour, retinal drusen , outer retinal atrophy, intraretinal fluid, intraretinal hyper-reflective material, no SRF (Interval development of SRF, and interval improvement in IRF/IRHM).   Notes *Images captured and stored on drive  Diagnosis / Impression:  OD: retinal drusen OS: exudative ARMD: Interval improvement in IRF/IRHM  Clinical management:  See below  Abbreviations: NFP - Normal foveal profile. CME - cystoid macular edema. PED - pigment epithelial detachment. IRF - intraretinal fluid. SRF - subretinal fluid. EZ - ellipsoid zone. ERM - epiretinal membrane. ORA - outer retinal atrophy. ORT - outer retinal tubulation. SRHM - subretinal hyper-reflective material         Intravitreal Injection, Pharmacologic Agent - OS - Left Eye       Time  Out 02/28/2018. 11:11 AM. Confirmed correct patient, procedure, site, and patient consented.   Anesthesia Topical anesthesia was used. Anesthetic medications included Tetracaine 0.5%, Lidocaine 2%.   Procedure Preparation included 5% betadine to ocular surface, eyelid speculum. A supplied needle was used.   Injection:  1.25 mg Bevacizumab 1.25mg /0.75ml   NDC: 50242-060-01, Lot: [id number] , Expiration date: 04/05/2018   Route: Intravitreal, Site: Left Eye, Waste: 0 mL  Post-op Post injection exam found visual acuity of at least  counting fingers. The patient tolerated the procedure well. There were no complications. The patient received written and verbal post procedure care education. Post injection medications were not given.                 ASSESSMENT/PLAN:    ICD-10-CM   1. Exudative age-related macular degeneration of left eye with active choroidal neovascularization (HCC) H35.3221 OCT, Retina - OU - Both Eyes    Intravitreal Injection, Pharmacologic Agent - OS - Left Eye    Bevacizumab (AVASTIN) SOLN 1.25 mg  2. Intermediate stage nonexudative age-related macular degeneration of right eye H35.3112   3. Retinal edema H35.81 OCT, Retina - OU - Both Eyes  4. PCO (posterior capsular opacification), right H26.491   5. Pseudophakia of both eyes Z96.1     1,3. Exudative age related macular degeneration OS.    - most recent FA shows mild CNV, possible RAP lesion temporal macula -- mild interval increase in leakage  - S/P IVA OS #1 (07.23.19), #2 (08.20.19)  - OCT today shows improved IRF and IRHM OS  - BCVA OS improved to 20/25  - recommend IVA OS #3 today (09.17.19) w/ extension to 5 wks  - RBA of procedure discussed, questions answered  - informed consent obtained and signed  - see procedure note  - f/u 5 weeks, DFE, OCT, possible injxn  2. Age related macular degeneration, non-exudative, right eye  - The incidence, anatomy, and pathology of dry AMD, risk of progression,  and the AREDS and AREDS 2 study including smoking risks discussed with patient.  - continue amsler grid monitoring  4. PCO OD-  - S/P Yag Laser OD (07.10.19) - excellent opening in capsule - pt reports subjective improvement in vision OD - VA stable at 20/40  5. Pseudophakia OU-   - s/p CE/IOL OU  - beautiful surgeries, doing well  - stable, monitor   Ophthalmic Meds Ordered this visit:  Meds ordered this encounter  Medications  . Bevacizumab (AVASTIN) SOLN 1.25 mg       Return in about 5 weeks (around 04/04/2018) for F/U Exu AMD OS, DFE, OCT.  There are no Patient Instructions on file for this visit.   Explained the diagnoses, plan, and follow up with the patient and they expressed understanding.  Patient expressed understanding of the importance of proper follow up care.   This document serves as a record of services personally performed by Gardiner Sleeper, MD, PhD. It was created on their behalf by Catha Brow, [place], a certified ophthalmic assistant. The creation of this record is the provider's dictation and/or activities during the visit.  Electronically signed by: Catha Brow, COA  09.16.19 8:52 AM   Gardiner Sleeper, M.D., Ph.D. Diseases & Surgery of the Retina and Vitreous [facility]  I have reviewed the above documentation for accuracy and completeness, and I agree with the above. Gardiner Sleeper, M.D., Ph.D. 03/01/18 8:53 AM    Abbreviations: M myopia (nearsighted); A astigmatism; H hyperopia (farsighted); P presbyopia; Mrx spectacle prescription;  CTL contact lenses; OD right eye; OS left eye; OU both eyes  XT exotropia; ET esotropia; PEK punctate epithelial keratitis; PEE punctate epithelial erosions; DES dry eye syndrome; MGD meibomian gland dysfunction; ATs artificial tears; PFAT's preservative free artificial tears; [place] nuclear sclerotic cataract; PSC posterior subcapsular cataract; ERM epi-retinal membrane; PVD posterior  vitreous detachment; RD retinal detachment; DM diabetes mellitus; DR diabetic retinopathy; NPDR non-proliferative diabetic retinopathy; PDR proliferative diabetic retinopathy; CSME  clinically significant macular edema; DME diabetic macular edema; dbh dot blot hemorrhages; CWS cotton wool spot; POAG primary open angle glaucoma; C/D cup-to-disc ratio; HVF humphrey visual field; GVF goldmann visual field; OCT optical coherence tomography; IOP intraocular pressure; BRVO Branch retinal vein occlusion; CRVO central retinal vein occlusion; CRAO central retinal artery occlusion; BRAO branch retinal artery occlusion; RT retinal tear; SB scleral buckle; PPV pars plana vitrectomy; VH Vitreous hemorrhage; PRP panretinal laser photocoagulation; IVK intravitreal kenalog; VMT vitreomacular traction; MH Macular hole;  NVD neovascularization of the disc; NVE neovascularization elsewhere; AREDS age related eye disease study; ARMD age related macular degeneration; POAG primary open angle glaucoma; EBMD epithelial/anterior basement membrane dystrophy; ACIOL anterior chamber intraocular lens; IOL intraocular lens; PCIOL posterior chamber intraocular lens; Phaco/IOL phacoemulsification with intraocular lens placement; [place] photorefractive keratectomy; LASIK laser assisted in situ keratomileusis; HTN hypertension; DM diabetes mellitus; COPD chronic obstructive pulmonary disease

## 2018-02-28 ENCOUNTER — Ambulatory Visit (INDEPENDENT_AMBULATORY_CARE_PROVIDER_SITE_OTHER): Payer: Medicare Other | Admitting: Ophthalmology

## 2018-02-28 ENCOUNTER — Encounter (INDEPENDENT_AMBULATORY_CARE_PROVIDER_SITE_OTHER): Payer: Self-pay | Admitting: Ophthalmology

## 2018-02-28 DIAGNOSIS — H353221 Exudative age-related macular degeneration, left eye, with active choroidal neovascularization: Secondary | ICD-10-CM | POA: Diagnosis not present

## 2018-02-28 DIAGNOSIS — Z961 Presence of intraocular lens: Secondary | ICD-10-CM

## 2018-02-28 DIAGNOSIS — H3581 Retinal edema: Secondary | ICD-10-CM | POA: Diagnosis not present

## 2018-02-28 DIAGNOSIS — H26491 Other secondary cataract, right eye: Secondary | ICD-10-CM

## 2018-02-28 DIAGNOSIS — H353112 Nonexudative age-related macular degeneration, right eye, intermediate dry stage: Secondary | ICD-10-CM | POA: Diagnosis not present

## 2018-02-28 MED ORDER — BEVACIZUMAB CHEMO INJECTION 1.25MG/0.05ML SYRINGE FOR KALEIDOSCOPE
1.2500 mg | INTRAVITREAL | Status: AC
  Administered 2018-02-28: 1.25 mg via INTRAVITREAL

## 2018-03-01 ENCOUNTER — Encounter (INDEPENDENT_AMBULATORY_CARE_PROVIDER_SITE_OTHER): Payer: Self-pay | Admitting: Ophthalmology

## 2018-03-01 DIAGNOSIS — M159 Polyosteoarthritis, unspecified: Secondary | ICD-10-CM | POA: Diagnosis not present

## 2018-03-01 DIAGNOSIS — E7800 Pure hypercholesterolemia, unspecified: Secondary | ICD-10-CM | POA: Diagnosis not present

## 2018-03-01 DIAGNOSIS — I10 Essential (primary) hypertension: Secondary | ICD-10-CM | POA: Diagnosis not present

## 2018-03-30 DIAGNOSIS — I10 Essential (primary) hypertension: Secondary | ICD-10-CM | POA: Diagnosis not present

## 2018-03-30 DIAGNOSIS — M159 Polyosteoarthritis, unspecified: Secondary | ICD-10-CM | POA: Diagnosis not present

## 2018-03-30 DIAGNOSIS — E7800 Pure hypercholesterolemia, unspecified: Secondary | ICD-10-CM | POA: Diagnosis not present

## 2018-04-04 ENCOUNTER — Encounter (INDEPENDENT_AMBULATORY_CARE_PROVIDER_SITE_OTHER): Payer: Medicare Other | Admitting: Ophthalmology

## 2018-04-04 DIAGNOSIS — Z299 Encounter for prophylactic measures, unspecified: Secondary | ICD-10-CM | POA: Diagnosis not present

## 2018-04-04 DIAGNOSIS — Z1339 Encounter for screening examination for other mental health and behavioral disorders: Secondary | ICD-10-CM | POA: Diagnosis not present

## 2018-04-04 DIAGNOSIS — R42 Dizziness and giddiness: Secondary | ICD-10-CM | POA: Diagnosis not present

## 2018-04-04 DIAGNOSIS — I10 Essential (primary) hypertension: Secondary | ICD-10-CM | POA: Diagnosis not present

## 2018-04-04 DIAGNOSIS — Z7189 Other specified counseling: Secondary | ICD-10-CM | POA: Diagnosis not present

## 2018-04-04 DIAGNOSIS — E785 Hyperlipidemia, unspecified: Secondary | ICD-10-CM | POA: Diagnosis not present

## 2018-04-04 DIAGNOSIS — R5383 Other fatigue: Secondary | ICD-10-CM | POA: Diagnosis not present

## 2018-04-04 DIAGNOSIS — J309 Allergic rhinitis, unspecified: Secondary | ICD-10-CM | POA: Diagnosis not present

## 2018-04-04 DIAGNOSIS — Z1331 Encounter for screening for depression: Secondary | ICD-10-CM | POA: Diagnosis not present

## 2018-04-04 DIAGNOSIS — Z0000 Encounter for general adult medical examination without abnormal findings: Secondary | ICD-10-CM | POA: Diagnosis not present

## 2018-04-04 DIAGNOSIS — Z6825 Body mass index (BMI) 25.0-25.9, adult: Secondary | ICD-10-CM | POA: Diagnosis not present

## 2018-04-04 NOTE — Progress Notes (Addendum)
[facility] Note  04/05/2018     CHIEF COMPLAINT Patient presents for Retina Follow Up   HISTORY OF PRESENT ILLNESS: Jacob Oliver is a 84 y.o. male who presents to the clinic today for:   HPI    Retina Follow Up    Patient presents with  Wet AMD.  In left eye.  This started 3 months ago.  Severity is mild.  Duration of 5 weeks.  Since onset it is gradually improving.  I, the attending physician,  performed the HPI with the patient and updated documentation appropriately.          Comments    F/U EXU AMD OS. S/P avastin OS- Patient states vision improved some OS. Some watering in am mostly OS.        Last edited by Bernarda Caffey, MD on 04/05/2018 11:59 AM. (History)     pt states he thinks his eye is doing really well, he states he can see very well  Referring physician: No referring provider defined for this encounter.  HISTORICAL INFORMATION:   Selected notes from the MEDICAL RECORD NUMBER Referred by Dr. Leander Rams for concern of Exudative AMD OS;  LEE- 02.21.19 ([facility]) [BCVA OD: 20/30 OS:20/20] Ocular Hx- pseudophakia OU ([facility] 10+ years ago) PMH-     CURRENT MEDICATIONS: No current outpatient medications on file. (Ophthalmic Drugs)   No current facility-administered medications for this visit.  (Ophthalmic Drugs)   Current Outpatient Medications (Other)  Medication Sig  . Calcium Carb-Ergocalciferol 500-200 MG-UNIT TABS Take by mouth.  Mariane Baumgarten Sodium 150 MG/15ML syrup Take 50 mg by mouth.  . ferrous sulfate 220 (44 Fe) MG/5ML solution Take 220 mg by mouth.  Marland Kitchen omeprazole (PRILOSEC) 20 MG capsule Take 20 mg by mouth.  . vitamin B-12 (CYANOCOBALAMIN) 500 MCG tablet Take by mouth.  . Vitamin D, Ergocalciferol, (DRISDOL) 50000 units CAPS capsule Take by mouth.   Current Facility-Administered Medications (Other)  Medication Route  . Bevacizumab (AVASTIN) SOLN 1.25 mg Intravitreal  . Bevacizumab (AVASTIN) SOLN 1.25 mg  Intravitreal  . Bevacizumab (AVASTIN) SOLN 1.25 mg Intravitreal  . Bevacizumab (AVASTIN) SOLN 1.25 mg Intravitreal      REVIEW OF SYSTEMS: ROS    Positive for: Eyes   Negative for: Constitutional, Gastrointestinal, Neurological, Skin, Genitourinary, Musculoskeletal, HENT, Endocrine, Cardiovascular, Respiratory, Psychiatric, Allergic/Imm, Heme/Lymph   Last edited by Zenovia Jordan, LPN on 63/87/5643 32:95 AM. (History)       ALLERGIES No Known Allergies  PAST MEDICAL HISTORY Past Medical History:  Diagnosis Date  . Cataract    Past Surgical History:  Procedure Laterality Date  . CATARACT EXTRACTION  2009/2002  . EYE SURGERY      FAMILY HISTORY Family History  Problem Relation Age of Onset  . Macular degeneration Mother   . Cataracts Mother   . Macular degeneration Sister     SOCIAL HISTORY Social History   Tobacco Use  . Smoking status: Former Research scientist (life sciences)  . Smokeless tobacco: Never Used  Substance Use Topics  . Alcohol use: Not on file  . Drug use: Not on file         OPHTHALMIC EXAM:  Base Eye Exam    Visual Acuity (Snellen - Linear)      Right Left   Dist cc 20/40 -2 20/40   Dist ph cc NI 20/30 -2   Correction:  Glasses       Tonometry (Tonopen, 10:43 AM)  Right Left   Pressure 17 16       Pupils      Dark Light Shape React APD   Right 4 3.5 Round Sluggish None   Left 4 3.5 Irregular Sluggish None       Visual Fields (Counting fingers)      Left Right    Full Full       Extraocular Movement      Right Left    Full, Ortho Full, Ortho       Neuro/Psych    Oriented x3:  Yes   Mood/Affect:  Normal       Dilation    Both eyes:  1.0% Mydriacyl, 2.5% Phenylephrine @ 10:43 AM        Slit Lamp and Fundus Exam    Slit Lamp Exam      Right Left   Lids/Lashes Telangiectasia, Dermatochalasis - upper lid Telangiectasia   Conjunctiva/Sclera Conjunctivochalasis White and quiet   Cornea Arcus, 1-2+ Punctate epithelial erosions Arcus,  1+ Punctate epithelial erosions   Anterior Chamber Deep and quiet Deep and quiet   Iris Round and dilated Round and dilated   Lens PC IOL in good position, open PC PC IOL in good position, clear PC   Vitreous Posterior vitreous detachment syneresis, Posterior vitreous detachment       Fundus Exam      Right Left   Disc Pink and Sharp Pink and Sharp   C/D Ratio 0.2 0.2   Macula Blunted foveal reflex, mild Retinal pigment epithelial mottling, No heme or edema Blunted foveal reflex, cluster of IRH and exudate perifoveal   Vessels Vascular attenuation Vascular attenuation   Periphery Attached Attached, mid-zonal drusen, blot hemorrhage temporal periphery         Refraction    Wearing Rx      Sphere Cylinder Axis   Right [place] +1.25 007   Left -1.50 +2.00 177       Manifest Refraction      Sphere Cylinder Axis Dist VA   Right       Left -2.00 +2.00 175 20/25          IMAGING AND PROCEDURES  Imaging and Procedures for 09/29/17  OCT, Retina - OU - Both Eyes       Right Eye Quality was good. Central Foveal Thickness: 260. Progression has been stable. Findings include normal foveal contour, no IRF, no SRF, retinal drusen , epiretinal membrane (Mild ERM).   Left Eye Quality was good. Central Foveal Thickness: 264. Progression has improved. Findings include abnormal foveal contour, retinal drusen , outer retinal atrophy, intraretinal fluid, intraretinal hyper-reflective material, no SRF (Interval development of SRF, and interval improvement in IRF/IRHM).   Notes *Images captured and stored on drive  Diagnosis / Impression:  OD: retinal drusen OS: exudative ARMD: Interval improvement in IRF/IRHM  Clinical management:  See below  Abbreviations: NFP - Normal foveal profile. CME - cystoid macular edema. PED - pigment epithelial detachment. IRF - intraretinal fluid. SRF - subretinal fluid. EZ - ellipsoid zone. ERM - epiretinal membrane. ORA - outer retinal atrophy. ORT - outer  retinal tubulation. SRHM - subretinal hyper-reflective material         Intravitreal Injection, Pharmacologic Agent - OS - Left Eye       Time Out 04/05/2018. 12:02 PM. Confirmed correct patient, procedure, site, and patient consented.   Anesthesia Topical anesthesia was used. Anesthetic medications included Tetracaine 0.5%, Lidocaine 2%.   Procedure Preparation included 5% betadine  to ocular surface, eyelid speculum. A 30 gauge needle was used.   Injection:  1.25 mg Bevacizumab 1.25mg /0.78ml   NDC: 50242-060-01, Lot: [id number] @32 , Expiration date: 05/25/2018   Route: Intravitreal, Site: Left Eye, Waste: 0 mg  Post-op Post injection exam found visual acuity of at least counting fingers. The patient tolerated the procedure well. There were no complications. The patient received written and verbal post procedure care education.                 ASSESSMENT/PLAN:    ICD-10-CM   1. Exudative age-related macular degeneration of left eye with active choroidal neovascularization (HCC) H35.3221 Intravitreal Injection, Pharmacologic Agent - OS - Left Eye    Bevacizumab (AVASTIN) SOLN 1.25 mg  2. Intermediate stage nonexudative age-related macular degeneration of right eye H35.3112   3. Retinal edema H35.81 OCT, Retina - OU - Both Eyes  4. PCO (posterior capsular opacification), right H26.491   5. Pseudophakia of both eyes Z96.1     1,3. Exudative age related macular degeneration OS.    - most recent FA shows mild CNV, possible RAP lesion temporal macula -- mild interval decrease in leakage  - S/P IVA OS #1 (07.23.19), #2 (08.20.19), #3 (09.17.19)  - OCT today shows improved IRF and IRHM OS  - BCVA OS improved to 20/25  - recommend IVA OS #4 today (10.23.19)  - RBA of procedure discussed, questions answered  - informed consent obtained and signed  - see procedure note  - f/u 6 weeks, DFE, OCT, FA [place] transit OS, possible focal laser  2. Age related macular  degeneration, non-exudative, right eye  - The incidence, anatomy, and pathology of dry AMD, risk of progression, and the AREDS and AREDS 2 study including smoking risks discussed with patient.  - continue amsler grid monitoring  4. PCO OD-  - S/P Yag Laser OD (07.10.19) - excellent opening in capsule - pt reports subjective improvement in vision OD - VA stable at 20/40  5. Pseudophakia OU-   - s/p CE/IOL OU  - beautiful surgeries, doing well  - stable, monitor   Ophthalmic Meds Ordered this visit:  Meds ordered this encounter  Medications  . Bevacizumab (AVASTIN) SOLN 1.25 mg       Return in about 6 weeks (around 05/17/2018) for F/U Exu ARMD OS, DFE, OCT, FA, possible laser.  There are no Patient Instructions on file for this visit.   Explained the diagnoses, plan, and follow up with the patient and they expressed understanding.  Patient expressed understanding of the importance of proper follow up care.   This document serves as a record of services personally performed by Gardiner Sleeper, MD, PhD. It was created on their behalf by Ernest Mallick, OA, an ophthalmic assistant. The creation of this record is the provider's dictation and/or activities during the visit.    Electronically signed by: Ernest Mallick, OA  10.22.19 8:19 AM    Gardiner Sleeper, M.D., Ph.D. Diseases & Surgery of the Retina and Vitreous [facility]   I have reviewed the above documentation for accuracy and completeness, and I agree with the above. Gardiner Sleeper, M.D., Ph.D. 04/06/18 8:19 AM    Abbreviations: M myopia (nearsighted); A astigmatism; H hyperopia (farsighted); P presbyopia; Mrx spectacle prescription;  CTL contact lenses; OD right eye; OS left eye; OU both eyes  XT exotropia; ET esotropia; PEK punctate epithelial keratitis; PEE punctate epithelial erosions; DES dry eye syndrome; MGD meibomian gland dysfunction; ATs  artificial tears; PFAT's preservative free artificial  tears; [place] nuclear sclerotic cataract; PSC posterior subcapsular cataract; ERM epi-retinal membrane; PVD posterior vitreous detachment; RD retinal detachment; DM diabetes mellitus; DR diabetic retinopathy; NPDR non-proliferative diabetic retinopathy; PDR proliferative diabetic retinopathy; CSME clinically significant macular edema; DME diabetic macular edema; dbh dot blot hemorrhages; CWS cotton wool spot; POAG primary open angle glaucoma; C/D cup-to-disc ratio; HVF humphrey visual field; GVF goldmann visual field; OCT optical coherence tomography; IOP intraocular pressure; BRVO Branch retinal vein occlusion; CRVO central retinal vein occlusion; CRAO central retinal artery occlusion; BRAO branch retinal artery occlusion; RT retinal tear; SB scleral buckle; PPV pars plana vitrectomy; VH Vitreous hemorrhage; PRP panretinal laser photocoagulation; IVK intravitreal kenalog; VMT vitreomacular traction; MH Macular hole;  NVD neovascularization of the disc; NVE neovascularization elsewhere; AREDS age related eye disease study; ARMD age related macular degeneration; POAG primary open angle glaucoma; EBMD epithelial/anterior basement membrane dystrophy; ACIOL anterior chamber intraocular lens; IOL intraocular lens; PCIOL posterior chamber intraocular lens; Phaco/IOL phacoemulsification with intraocular lens placement; [place] photorefractive keratectomy; LASIK laser assisted in situ keratomileusis; HTN hypertension; DM diabetes mellitus; COPD chronic obstructive pulmonary disease

## 2018-04-05 ENCOUNTER — Encounter (INDEPENDENT_AMBULATORY_CARE_PROVIDER_SITE_OTHER): Payer: Self-pay | Admitting: Ophthalmology

## 2018-04-05 ENCOUNTER — Ambulatory Visit (INDEPENDENT_AMBULATORY_CARE_PROVIDER_SITE_OTHER): Payer: Medicare Other | Admitting: Ophthalmology

## 2018-04-05 DIAGNOSIS — H3581 Retinal edema: Secondary | ICD-10-CM | POA: Diagnosis not present

## 2018-04-05 DIAGNOSIS — H353112 Nonexudative age-related macular degeneration, right eye, intermediate dry stage: Secondary | ICD-10-CM | POA: Diagnosis not present

## 2018-04-05 DIAGNOSIS — Z961 Presence of intraocular lens: Secondary | ICD-10-CM

## 2018-04-05 DIAGNOSIS — H26491 Other secondary cataract, right eye: Secondary | ICD-10-CM

## 2018-04-05 DIAGNOSIS — H353221 Exudative age-related macular degeneration, left eye, with active choroidal neovascularization: Secondary | ICD-10-CM | POA: Diagnosis not present

## 2018-04-05 MED ORDER — BEVACIZUMAB CHEMO INJECTION 1.25MG/0.05ML SYRINGE FOR KALEIDOSCOPE
1.2500 mg | INTRAVITREAL | Status: AC
  Administered 2018-04-05: 1.25 mg via INTRAVITREAL

## 2018-04-10 DIAGNOSIS — E785 Hyperlipidemia, unspecified: Secondary | ICD-10-CM | POA: Diagnosis not present

## 2018-04-10 DIAGNOSIS — R5383 Other fatigue: Secondary | ICD-10-CM | POA: Diagnosis not present

## 2018-04-10 DIAGNOSIS — Z125 Encounter for screening for malignant neoplasm of prostate: Secondary | ICD-10-CM | POA: Diagnosis not present

## 2018-04-10 DIAGNOSIS — Z79899 Other long term (current) drug therapy: Secondary | ICD-10-CM | POA: Diagnosis not present

## 2018-04-11 DIAGNOSIS — H81393 Other peripheral vertigo, bilateral: Secondary | ICD-10-CM | POA: Diagnosis not present

## 2018-04-11 DIAGNOSIS — G9009 Other idiopathic peripheral autonomic neuropathy: Secondary | ICD-10-CM | POA: Diagnosis not present

## 2018-04-11 DIAGNOSIS — I7389 Other specified peripheral vascular diseases: Secondary | ICD-10-CM | POA: Diagnosis not present

## 2018-04-27 DIAGNOSIS — R42 Dizziness and giddiness: Secondary | ICD-10-CM | POA: Diagnosis not present

## 2018-04-27 DIAGNOSIS — Z6822 Body mass index (BMI) 22.0-22.9, adult: Secondary | ICD-10-CM | POA: Diagnosis not present

## 2018-04-27 DIAGNOSIS — I10 Essential (primary) hypertension: Secondary | ICD-10-CM | POA: Diagnosis not present

## 2018-04-27 DIAGNOSIS — Z299 Encounter for prophylactic measures, unspecified: Secondary | ICD-10-CM | POA: Diagnosis not present

## 2018-04-28 DIAGNOSIS — M159 Polyosteoarthritis, unspecified: Secondary | ICD-10-CM | POA: Diagnosis not present

## 2018-04-28 DIAGNOSIS — I10 Essential (primary) hypertension: Secondary | ICD-10-CM | POA: Diagnosis not present

## 2018-04-28 DIAGNOSIS — E7800 Pure hypercholesterolemia, unspecified: Secondary | ICD-10-CM | POA: Diagnosis not present

## 2018-05-16 NOTE — Progress Notes (Addendum)
[facility] Note  05/17/2018     CHIEF COMPLAINT Patient presents for Retina Follow Up   HISTORY OF PRESENT ILLNESS: Jacob Oliver is a 84 y.o. male who presents to the clinic today for:   HPI    Retina Follow Up    Patient presents with  Wet AMD.  In left eye.  Severity is moderate.  Duration of 6 weeks.  Since onset it is stable.  I, the attending physician,  performed the HPI with the patient and updated documentation appropriately.          Comments    Patient states vision the same OU. No eye pain/discomfort.        Last edited by Bernarda Caffey, MD on 05/19/2018 12:32 AM. (History)      Referring physician: No referring provider defined for this encounter.  HISTORICAL INFORMATION:   Selected notes from the MEDICAL RECORD NUMBER Referred by Dr. Leander Rams for concern of Exudative AMD OS;  LEE- 02.21.19 ([facility]) [BCVA OD: 20/30 OS:20/20] Ocular Hx- pseudophakia OU ([facility] 10+ years ago) PMH-     CURRENT MEDICATIONS: No current outpatient medications on file. (Ophthalmic Drugs)   No current facility-administered medications for this visit.  (Ophthalmic Drugs)   Current Outpatient Medications (Other)  Medication Sig  . Calcium Carb-Ergocalciferol 500-200 MG-UNIT TABS Take by mouth.  Mariane Baumgarten Sodium 150 MG/15ML syrup Take 50 mg by mouth.  . ferrous sulfate 220 (44 Fe) MG/5ML solution Take 220 mg by mouth.  Marland Kitchen omeprazole (PRILOSEC) 20 MG capsule Take 20 mg by mouth.  . vitamin B-12 (CYANOCOBALAMIN) 500 MCG tablet Take by mouth.  . Vitamin D, Ergocalciferol, (DRISDOL) 50000 units CAPS capsule Take by mouth.   Current Facility-Administered Medications (Other)  Medication Route  . Bevacizumab (AVASTIN) SOLN 1.25 mg Intravitreal  . Bevacizumab (AVASTIN) SOLN 1.25 mg Intravitreal  . Bevacizumab (AVASTIN) SOLN 1.25 mg Intravitreal  . Bevacizumab (AVASTIN) SOLN 1.25 mg Intravitreal  . Bevacizumab (AVASTIN) SOLN 1.25 mg  Intravitreal      REVIEW OF SYSTEMS: ROS    Positive for: Eyes   Negative for: Constitutional, Gastrointestinal, Neurological, Skin, Genitourinary, Musculoskeletal, HENT, Endocrine, Cardiovascular, Respiratory, Psychiatric, Allergic/Imm, Heme/Lymph   Last edited by Roselee Nova D on 05/17/2018 10:47 AM. (History)       ALLERGIES No Known Allergies  PAST MEDICAL HISTORY Past Medical History:  Diagnosis Date  . Cataract    Past Surgical History:  Procedure Laterality Date  . CATARACT EXTRACTION  2009/2002  . EYE SURGERY      FAMILY HISTORY Family History  Problem Relation Age of Onset  . Macular degeneration Mother   . Cataracts Mother   . Macular degeneration Sister     SOCIAL HISTORY Social History   Tobacco Use  . Smoking status: Former Research scientist (life sciences)  . Smokeless tobacco: Never Used  Substance Use Topics  . Alcohol use: Not on file  . Drug use: Not on file         OPHTHALMIC EXAM:  Base Eye Exam    Visual Acuity (Snellen - Linear)      Right Left   Dist cc 20/40 20/30   Dist ph cc NI NI   Correction:  Glasses       Tonometry (Tonopen, 10:53 AM)      Right Left   Pressure 21 20       Pupils      Dark Light Shape React APD   Right 4  3 Round Slow None   Left 4 3.5 Irregular Slow None       Visual Fields (Counting fingers)      Left Right    Full Full       Extraocular Movement      Right Left    Full, Ortho Full, Ortho       Neuro/Psych    Oriented x3:  Yes   Mood/Affect:  Normal       Dilation    Both eyes:  1.0% Mydriacyl, 2.5% Phenylephrine @ 10:53 AM        Slit Lamp and Fundus Exam    Slit Lamp Exam      Right Left   Lids/Lashes Telangiectasia, Dermatochalasis - upper lid Telangiectasia   Conjunctiva/Sclera Conjunctivochalasis White and quiet   Cornea Arcus, 1-2+ Punctate epithelial erosions Arcus, 1+ Punctate epithelial erosions   Anterior Chamber Deep and quiet Deep and quiet   Iris Round and dilated Round and dilated    Lens PC IOL in good position, open PC PC IOL in good position, clear PC   Vitreous Posterior vitreous detachment syneresis, Posterior vitreous detachment       Fundus Exam      Right Left   Disc Pink and Sharp Pink and Sharp   C/D Ratio 0.2 0.2   Macula Blunted foveal reflex, mild Retinal pigment epithelial mottling, No heme or edema Blunted foveal reflex, cluster of IRH and exudate perifoveal, pigment clumping nasal to fovea    Vessels Vascular attenuation Vascular attenuation   Periphery Attached Attached, mid-zonal drusen, blot hemorrhage temporal periphery         Refraction    Wearing Rx      Sphere Cylinder Axis   Right [place] +1.25 007   Left -1.50 +2.00 177          IMAGING AND PROCEDURES  Imaging and Procedures for 09/29/17  OCT, Retina - OU - Both Eyes       Right Eye Quality was good. Central Foveal Thickness: 268. Progression has been stable. Findings include normal foveal contour, no IRF, no SRF, retinal drusen , epiretinal membrane (Mild ERM).   Left Eye Quality was good. Central Foveal Thickness: 287. Progression has improved. Findings include abnormal foveal contour, retinal drusen , outer retinal atrophy, intraretinal fluid, intraretinal hyper-reflective material, no SRF (persistent cystic changes temporal fovea).   Notes *Images captured and stored on drive  Diagnosis / Impression:  OD: retinal drusen OS: exudative ARMD: persistent IRF  Clinical management:  See below  Abbreviations: NFP - Normal foveal profile. CME - cystoid macular edema. PED - pigment epithelial detachment. IRF - intraretinal fluid. SRF - subretinal fluid. EZ - ellipsoid zone. ERM - epiretinal membrane. ORA - outer retinal atrophy. ORT - outer retinal tubulation. SRHM - subretinal hyper-reflective material         Intravitreal Injection, Pharmacologic Agent - OS - Left Eye       Time Out 05/17/2018. 12:34 PM. Confirmed correct patient, procedure, site, and patient  consented.   Anesthesia Topical anesthesia was used. Anesthetic medications included Lidocaine 2%, Proparacaine 0.5%.   Procedure Preparation included 5% betadine to ocular surface, eyelid speculum. A 30 gauge needle was used.   Injection:  1.25 mg Bevacizumab 1.25mg /0.69ml   NDC: 56387-564-33, Lot: [id number] , Expiration date: 06/29/2018   Route: Intravitreal, Site: Left Eye, Waste: 0 mL  Post-op Post injection exam found visual acuity of at least counting fingers. The patient tolerated the procedure well. There were no  complications. The patient received written and verbal post procedure care education.        Fluorescein Angiography [place] (Transit OS)       Right Eye   Progression has no prior data. Early phase findings include staining. Mid/Late phase findings include staining (Punctate hyperfluorescence along ST arcade).   Left Eye   Progression has no prior data. Early phase findings include choroidal neovascularization, staining. Mid/Late phase findings include choroidal neovascularization, leakage.   Notes Images stored on drive;   Impression: OD: Punctate hyperfluorescence of ST arcade OS: Punctate hyperfluorescent leakage temporal fovea consistent with CNVM                  ASSESSMENT/PLAN:    ICD-10-CM   1. Exudative age-related macular degeneration of left eye with active choroidal neovascularization (HCC) H35.3221 Intravitreal Injection, Pharmacologic Agent - OS - Left Eye    Fluorescein Angiography [place] (Transit OS)    Bevacizumab (AVASTIN) SOLN 1.25 mg  2. Intermediate stage nonexudative age-related macular degeneration of right eye H35.3112   3. Retinal edema H35.81 OCT, Retina - OU - Both Eyes  4. PCO (posterior capsular opacification), right H26.491   5. Pseudophakia of both eyes Z96.1     1,3. Exudative age related macular degeneration OS.    - most recent FA shows mild CNV, possible RAP lesion temporal macula -- mild  interval decrease in leakage  - S/P IVA OS #1 (07.23.19), #2 (08.20.19), #3 (09.17.19), #4 (10.23.19)  - OCT today shows improved IRF and IRHM OS at 6 wk interval  - BCVA OS improved to 20/30  - recommend IVA OS #5 today (12.03.19) with extension to 8 wks  - RBA of procedure discussed, questions answered  - informed consent obtained and signed  - see procedure note  - f/u 8 weeks, DFE, OCT  2. Age related macular degeneration, non-exudative, right eye  - The incidence, anatomy, and pathology of dry AMD, risk of progression, and the AREDS and AREDS 2 study including smoking risks discussed with patient.  - continue amsler grid monitoring  4. PCO OD-  - S/P Yag Laser OD (07.10.19) - excellent opening in capsule - pt reports subjective improvement in vision OD - VA stable at 20/40  5. Pseudophakia OU-   - s/p CE/IOL OU  - beautiful surgeries, doing well  - stable, monitor   Ophthalmic Meds Ordered this visit:  Meds ordered this encounter  Medications  . Bevacizumab (AVASTIN) SOLN 1.25 mg       Return in about 8 weeks (around 07/12/2018) for DFE, OCT.  There are no Patient Instructions on file for this visit.   Explained the diagnoses, plan, and follow up with the patient and they expressed understanding.  Patient expressed understanding of the importance of proper follow up care.   This document serves as a record of services personally performed by Gardiner Sleeper, MD, PhD. It was created on their behalf by Ernest Mallick, OA, an ophthalmic assistant. The creation of this record is the provider's dictation and/or activities during the visit.    Electronically signed by: Ernest Mallick, OA  12.03.19 1:22 AM    Gardiner Sleeper, M.D., Ph.D. Diseases & Surgery of the Retina and Vitreous [facility]  I have reviewed the above documentation for accuracy and completeness, and I agree with the above. Gardiner Sleeper, M.D., Ph.D. 05/21/18 1:22  AM   Abbreviations: M myopia (nearsighted); A astigmatism; H hyperopia (farsighted); P presbyopia; Mrx  spectacle prescription;  CTL contact lenses; OD right eye; OS left eye; OU both eyes  XT exotropia; ET esotropia; PEK punctate epithelial keratitis; PEE punctate epithelial erosions; DES dry eye syndrome; MGD meibomian gland dysfunction; ATs artificial tears; PFAT's preservative free artificial tears; [place] nuclear sclerotic cataract; PSC posterior subcapsular cataract; ERM epi-retinal membrane; PVD posterior vitreous detachment; RD retinal detachment; DM diabetes mellitus; DR diabetic retinopathy; NPDR non-proliferative diabetic retinopathy; PDR proliferative diabetic retinopathy; CSME clinically significant macular edema; DME diabetic macular edema; dbh dot blot hemorrhages; CWS cotton wool spot; POAG primary open angle glaucoma; C/D cup-to-disc ratio; HVF humphrey visual field; GVF goldmann visual field; OCT optical coherence tomography; IOP intraocular pressure; BRVO Branch retinal vein occlusion; CRVO central retinal vein occlusion; CRAO central retinal artery occlusion; BRAO branch retinal artery occlusion; RT retinal tear; SB scleral buckle; PPV pars plana vitrectomy; VH Vitreous hemorrhage; PRP panretinal laser photocoagulation; IVK intravitreal kenalog; VMT vitreomacular traction; MH Macular hole;  NVD neovascularization of the disc; NVE neovascularization elsewhere; AREDS age related eye disease study; ARMD age related macular degeneration; POAG primary open angle glaucoma; EBMD epithelial/anterior basement membrane dystrophy; ACIOL anterior chamber intraocular lens; IOL intraocular lens; PCIOL posterior chamber intraocular lens; Phaco/IOL phacoemulsification with intraocular lens placement; [place] photorefractive keratectomy; LASIK laser assisted in situ keratomileusis; HTN hypertension; DM diabetes mellitus; COPD chronic obstructive pulmonary disease

## 2018-05-17 ENCOUNTER — Encounter (INDEPENDENT_AMBULATORY_CARE_PROVIDER_SITE_OTHER): Payer: Self-pay | Admitting: Ophthalmology

## 2018-05-17 ENCOUNTER — Ambulatory Visit (INDEPENDENT_AMBULATORY_CARE_PROVIDER_SITE_OTHER): Payer: Medicare Other | Admitting: Ophthalmology

## 2018-05-17 DIAGNOSIS — Z961 Presence of intraocular lens: Secondary | ICD-10-CM

## 2018-05-17 DIAGNOSIS — H3581 Retinal edema: Secondary | ICD-10-CM

## 2018-05-17 DIAGNOSIS — H353112 Nonexudative age-related macular degeneration, right eye, intermediate dry stage: Secondary | ICD-10-CM | POA: Diagnosis not present

## 2018-05-17 DIAGNOSIS — H353221 Exudative age-related macular degeneration, left eye, with active choroidal neovascularization: Secondary | ICD-10-CM

## 2018-05-17 DIAGNOSIS — H26491 Other secondary cataract, right eye: Secondary | ICD-10-CM

## 2018-05-21 ENCOUNTER — Encounter (INDEPENDENT_AMBULATORY_CARE_PROVIDER_SITE_OTHER): Payer: Self-pay | Admitting: Ophthalmology

## 2018-05-21 MED ORDER — BEVACIZUMAB CHEMO INJECTION 1.25MG/0.05ML SYRINGE FOR KALEIDOSCOPE
1.2500 mg | INTRAVITREAL | Status: AC
  Administered 2018-05-21: 1.25 mg via INTRAVITREAL

## 2018-06-08 DIAGNOSIS — M159 Polyosteoarthritis, unspecified: Secondary | ICD-10-CM | POA: Diagnosis not present

## 2018-06-08 DIAGNOSIS — I10 Essential (primary) hypertension: Secondary | ICD-10-CM | POA: Diagnosis not present

## 2018-06-08 DIAGNOSIS — E7800 Pure hypercholesterolemia, unspecified: Secondary | ICD-10-CM | POA: Diagnosis not present

## 2018-07-10 NOTE — Progress Notes (Signed)
[facility] Note  07/12/2018     CHIEF COMPLAINT Patient presents for Retina Follow Up   HISTORY OF PRESENT ILLNESS: Jacob Oliver is a 85 y.o. male who presents to the clinic today for:   HPI    Retina Follow Up    Patient presents with  Wet AMD.  In left eye.  This started 8 weeks ago.  Severity is moderate.  Duration of 8 weeks.  Since onset it is gradually improving.  I, the attending physician,  performed the HPI with the patient and updated documentation appropriately.          Comments    Patient here for 8 weeks Exu ARMD OS. Patient states vision doing fairly well. No eye pain       Last edited by Bernarda Caffey, MD on 07/12/2018 11:25 AM. (History)      Referring physician: No referring provider defined for this encounter.  HISTORICAL INFORMATION:   Selected notes from the MEDICAL RECORD NUMBER Referred by Dr. Leander Rams for concern of Exudative AMD OS;  LEE- 02.21.19 ([facility]) [BCVA OD: 20/30 OS:20/20] Ocular Hx- pseudophakia OU ([facility] 10+ years ago) PMH-     CURRENT MEDICATIONS: No current outpatient medications on file. (Ophthalmic Drugs)   No current facility-administered medications for this visit.  (Ophthalmic Drugs)   Current Outpatient Medications (Other)  Medication Sig  . Calcium Carb-Ergocalciferol 500-200 MG-UNIT TABS Take by mouth.  Mariane Baumgarten Sodium 150 MG/15ML syrup Take 50 mg by mouth.  . ferrous sulfate 220 (44 Fe) MG/5ML solution Take 220 mg by mouth.  Marland Kitchen omeprazole (PRILOSEC) 20 MG capsule Take 20 mg by mouth.  . vitamin B-12 (CYANOCOBALAMIN) 500 MCG tablet Take by mouth.  . Vitamin D, Ergocalciferol, (DRISDOL) 50000 units CAPS capsule Take by mouth.   Current Facility-Administered Medications (Other)  Medication Route  . Bevacizumab (AVASTIN) SOLN 1.25 mg Intravitreal  . Bevacizumab (AVASTIN) SOLN 1.25 mg Intravitreal  . Bevacizumab (AVASTIN) SOLN 1.25 mg Intravitreal  . Bevacizumab (AVASTIN)  SOLN 1.25 mg Intravitreal  . Bevacizumab (AVASTIN) SOLN 1.25 mg Intravitreal      REVIEW OF SYSTEMS: ROS    Positive for: HENT, Eyes   Negative for: Constitutional, Gastrointestinal, Neurological, Skin, Genitourinary, Musculoskeletal, Endocrine, Cardiovascular, Respiratory, Psychiatric, Allergic/Imm, Heme/Lymph   Last edited by Theodore Demark on 07/12/2018 10:06 AM. (History)       ALLERGIES No Known Allergies  PAST MEDICAL HISTORY Past Medical History:  Diagnosis Date  . Cataract    Past Surgical History:  Procedure Laterality Date  . CATARACT EXTRACTION  2009/2002  . EYE SURGERY      FAMILY HISTORY Family History  Problem Relation Age of Onset  . Macular degeneration Mother   . Cataracts Mother   . Macular degeneration Sister     SOCIAL HISTORY Social History   Tobacco Use  . Smoking status: Former Research scientist (life sciences)  . Smokeless tobacco: Never Used  Substance Use Topics  . Alcohol use: Not on file  . Drug use: Not on file         OPHTHALMIC EXAM:  Base Eye Exam    Visual Acuity (Snellen - Linear)      Right Left   Dist cc 20/30 20/30   Dist ph cc NI 20/25 -2       Tonometry (Tonopen, 10:02 AM)      Right Left   Pressure 18 17       Pupils  Dark Light Shape React APD   Right 4 3 Round Slow None   Left 4 3.5 Irregular Slow None       Visual Fields (Counting fingers)      Left Right    Full Full       Extraocular Movement      Right Left    Full, Ortho Full, Ortho       Neuro/Psych    Oriented x3:  Yes   Mood/Affect:  Normal       Dilation    Both eyes:  1.0% Mydriacyl @ 10:02 AM        Slit Lamp and Fundus Exam    Slit Lamp Exam      Right Left   Lids/Lashes Telangiectasia, Dermatochalasis - upper lid Telangiectasia   Conjunctiva/Sclera Conjunctivochalasis White and quiet   Cornea Arcus, 1-2+ Punctate epithelial erosions Arcus, 1+ Punctate epithelial erosions   Anterior Chamber Deep and quiet Deep and quiet   Iris Round and  dilated Round and dilated   Lens PC IOL in good position, open PC PC IOL in good position, clear PC   Vitreous Posterior vitreous detachment syneresis, Posterior vitreous detachment       Fundus Exam      Right Left   Disc Pink and Sharp Pink and Sharp   C/D Ratio 0.2 0.2   Macula Blunted foveal reflex, mild Retinal pigment epithelial mottling, No heme or edema Blunted foveal reflex, cluster of IRH and exudate perifoveal -- improved, pigment clumping nasal to fovea    Vessels Vascular attenuation Vascular attenuation   Periphery Attached Attached, mid-zonal drusen, blot hemorrhage temporal periphery         Refraction    Wearing Rx      Sphere Cylinder Axis   Right [place] +1.25 007   Left -1.50 +2.00 177          IMAGING AND PROCEDURES  Imaging and Procedures for 09/29/17  OCT, Retina - OU - Both Eyes       Right Eye Quality was good. Central Foveal Thickness: 259. Progression has been stable. Findings include normal foveal contour, no IRF, no SRF, retinal drusen , epiretinal membrane (Mild ERM).   Left Eye Quality was good. Central Foveal Thickness: 281. Progression has improved. Findings include abnormal foveal contour, retinal drusen , outer retinal atrophy, intraretinal fluid, intraretinal hyper-reflective material, no SRF (Interval improvement in cystic changes temporal fovea).   Notes *Images captured and stored on drive  Diagnosis / Impression:  OD: retinal drusen OS: exudative ARMD: interval improvement in IRF  Clinical management:  See below  Abbreviations: NFP - Normal foveal profile. CME - cystoid macular edema. PED - pigment epithelial detachment. IRF - intraretinal fluid. SRF - subretinal fluid. EZ - ellipsoid zone. ERM - epiretinal membrane. ORA - outer retinal atrophy. ORT - outer retinal tubulation. SRHM - subretinal hyper-reflective material                  ASSESSMENT/PLAN:    ICD-10-CM   1. Exudative age-related macular degeneration of  left eye with active choroidal neovascularization (HCC) H35.3221 CANCELED: Intravitreal Injection, Pharmacologic Agent - OS - Left Eye  2. Intermediate stage nonexudative age-related macular degeneration of right eye H35.3112   3. Retinal edema H35.81 OCT, Retina - OU - Both Eyes  4. PCO (posterior capsular opacification), right H26.491   5. Pseudophakia of both eyes Z96.1     1,3. Exudative age related macular degeneration OS.    -  most recent FA shows mild CNV, possible RAP lesion temporal macula -- mild interval decrease in leakage  - S/P IVA OS #1 (07.23.19), #2 (08.20.19), #3 (09.17.19), #4 (10.23.19), #5 (12.03.19)  - OCT today shows improved IRF and IRHM OS at 8 wk interval  - BCVA OS improved to 20/25-2  - discussed further treat and extend regimen to 10 wks vs holding   - pt wishes to hold and follow up in 6 weeks -- reasonable  - f/u 6 weeks, DFE, OCT  2. Age related macular degeneration, non-exudative, right eye  - The incidence, anatomy, and pathology of dry AMD, risk of progression, and the AREDS and AREDS 2 study including smoking risks discussed with patient.  - continue amsler grid monitoring  4. PCO OD-  - S/P Yag Laser OD (07.10.19) - excellent opening in capsule - pt reports subjective improvement in vision OD - VA stable at 20/30  5. Pseudophakia OU-   - s/p CE/IOL OU  - beautiful surgeries, doing well  -stable, monitor    Ophthalmic Meds Ordered this visit:  No orders of the defined types were placed in this encounter.      Return in about 6 weeks (around 08/23/2018) for f/u exu ARMD OS, DFE, OCT.  There are no Patient Instructions on file for this visit.   Explained the diagnoses, plan, and follow up with the patient and they expressed understanding.  Patient expressed understanding of the importance of proper follow up care.   This document serves as a record of services personally performed by Gardiner Sleeper, MD, PhD. It was created on their behalf  by Ernest Mallick, OA, an ophthalmic assistant. The creation of this record is the provider's dictation and/or activities during the visit.    Electronically signed by: Ernest Mallick, OA  01.27.2020 11:43 AM     Gardiner Sleeper, M.D., Ph.D. Diseases & Surgery of the Retina and Vitreous [facility]  I have reviewed the above documentation for accuracy and completeness, and I agree with the above. Gardiner Sleeper, M.D., Ph.D. 07/12/18 11:45 AM     Abbreviations: M myopia (nearsighted); A astigmatism; H hyperopia (farsighted); P presbyopia; Mrx spectacle prescription;  CTL contact lenses; OD right eye; OS left eye; OU both eyes  XT exotropia; ET esotropia; PEK punctate epithelial keratitis; PEE punctate epithelial erosions; DES dry eye syndrome; MGD meibomian gland dysfunction; ATs artificial tears; PFAT's preservative free artificial tears; [place] nuclear sclerotic cataract; PSC posterior subcapsular cataract; ERM epi-retinal membrane; PVD posterior vitreous detachment; RD retinal detachment; DM diabetes mellitus; DR diabetic retinopathy; NPDR non-proliferative diabetic retinopathy; PDR proliferative diabetic retinopathy; CSME clinically significant macular edema; DME diabetic macular edema; dbh dot blot hemorrhages; CWS cotton wool spot; POAG primary open angle glaucoma; C/D cup-to-disc ratio; HVF humphrey visual field; GVF goldmann visual field; OCT optical coherence tomography; IOP intraocular pressure; BRVO Branch retinal vein occlusion; CRVO central retinal vein occlusion; CRAO central retinal artery occlusion; BRAO branch retinal artery occlusion; RT retinal tear; SB scleral buckle; PPV pars plana vitrectomy; VH Vitreous hemorrhage; PRP panretinal laser photocoagulation; IVK intravitreal kenalog; VMT vitreomacular traction; MH Macular hole;  NVD neovascularization of the disc; NVE neovascularization elsewhere; AREDS age related eye disease study; ARMD age related macular  degeneration; POAG primary open angle glaucoma; EBMD epithelial/anterior basement membrane dystrophy; ACIOL anterior chamber intraocular lens; IOL intraocular lens; PCIOL posterior chamber intraocular lens; Phaco/IOL phacoemulsification with intraocular lens placement; PRK photorefractive keratectomy; LASIK laser assisted in situ keratomileusis; HTN  hypertension; DM diabetes mellitus; COPD chronic obstructive pulmonary disease

## 2018-07-12 ENCOUNTER — Encounter (INDEPENDENT_AMBULATORY_CARE_PROVIDER_SITE_OTHER): Payer: Self-pay | Admitting: Ophthalmology

## 2018-07-12 ENCOUNTER — Ambulatory Visit (INDEPENDENT_AMBULATORY_CARE_PROVIDER_SITE_OTHER): Payer: Medicare Other | Admitting: Ophthalmology

## 2018-07-12 DIAGNOSIS — Z961 Presence of intraocular lens: Secondary | ICD-10-CM | POA: Diagnosis not present

## 2018-07-12 DIAGNOSIS — H353221 Exudative age-related macular degeneration, left eye, with active choroidal neovascularization: Secondary | ICD-10-CM

## 2018-07-12 DIAGNOSIS — H26491 Other secondary cataract, right eye: Secondary | ICD-10-CM

## 2018-07-12 DIAGNOSIS — H3581 Retinal edema: Secondary | ICD-10-CM | POA: Diagnosis not present

## 2018-07-12 DIAGNOSIS — H353112 Nonexudative age-related macular degeneration, right eye, intermediate dry stage: Secondary | ICD-10-CM | POA: Diagnosis not present

## 2018-08-01 DIAGNOSIS — M159 Polyosteoarthritis, unspecified: Secondary | ICD-10-CM | POA: Diagnosis not present

## 2018-08-01 DIAGNOSIS — I10 Essential (primary) hypertension: Secondary | ICD-10-CM | POA: Diagnosis not present

## 2018-08-01 DIAGNOSIS — E7800 Pure hypercholesterolemia, unspecified: Secondary | ICD-10-CM | POA: Diagnosis not present

## 2018-08-21 NOTE — Progress Notes (Signed)
[facility] Note  08/23/2018     CHIEF COMPLAINT Patient presents for Retina Follow Up   HISTORY OF PRESENT ILLNESS: Jacob Oliver is a 85 y.o. male who presents to the clinic today for:   HPI    Retina Follow Up    Patient presents with  Wet AMD.  In left eye.  Severity is moderate.  Duration of 6 weeks.  Since onset it is stable.  I, the attending physician,  performed the HPI with the patient and updated documentation appropriately.          Comments    6 wk f/u for exudative ARMD OS. Last injection was IVA #5 OS on 12.3.19 ~14 wks ago. Reports stable vision. No new complaints       Last edited by Bernarda Caffey, MD on 08/26/2018 11:19 AM. (History)    Patient states that he still sees reasonably well  Referring physician: No referring provider defined for this encounter.  HISTORICAL INFORMATION:   Selected notes from the MEDICAL RECORD NUMBER Referred by Dr. Leander Rams for concern of Exudative AMD OS;  LEE- 02.21.19 ([facility]) [BCVA OD: 20/30 OS:20/20] Ocular Hx- pseudophakia OU ([facility] 10+ years ago) PMH-     CURRENT MEDICATIONS: No current outpatient medications on file. (Ophthalmic Drugs)   No current facility-administered medications for this visit.  (Ophthalmic Drugs)   Current Outpatient Medications (Other)  Medication Sig  . Calcium Carb-Ergocalciferol 500-200 MG-UNIT TABS Take by mouth.  Mariane Baumgarten Sodium 150 MG/15ML syrup Take 50 mg by mouth.  . ferrous sulfate 220 (44 Fe) MG/5ML solution Take 220 mg by mouth.  Marland Kitchen omeprazole (PRILOSEC) 20 MG capsule Take 20 mg by mouth.  . vitamin B-12 (CYANOCOBALAMIN) 500 MCG tablet Take by mouth.  . Vitamin D, Ergocalciferol, (DRISDOL) 50000 units CAPS capsule Take by mouth.   Current Facility-Administered Medications (Other)  Medication Route  . Bevacizumab (AVASTIN) SOLN 1.25 mg Intravitreal  . Bevacizumab (AVASTIN) SOLN 1.25 mg Intravitreal  . Bevacizumab (AVASTIN) SOLN 1.25  mg Intravitreal  . Bevacizumab (AVASTIN) SOLN 1.25 mg Intravitreal  . Bevacizumab (AVASTIN) SOLN 1.25 mg Intravitreal      REVIEW OF SYSTEMS: ROS    Positive for: HENT, Eyes   Negative for: Constitutional, Gastrointestinal, Neurological, Skin, Genitourinary, Musculoskeletal, Endocrine, Cardiovascular, Respiratory, Psychiatric, Allergic/Imm, Heme/Lymph   Last edited by Roselee Nova D on 08/23/2018  9:56 AM. (History)       ALLERGIES No Known Allergies  PAST MEDICAL HISTORY Past Medical History:  Diagnosis Date  . Cataract    Past Surgical History:  Procedure Laterality Date  . CATARACT EXTRACTION  2009/2002  . EYE SURGERY      FAMILY HISTORY Family History  Problem Relation Age of Onset  . Macular degeneration Mother   . Cataracts Mother   . Macular degeneration Sister     SOCIAL HISTORY Social History   Tobacco Use  . Smoking status: Former Research scientist (life sciences)  . Smokeless tobacco: Never Used  Substance Use Topics  . Alcohol use: Not on file  . Drug use: Not on file         OPHTHALMIC EXAM:  Base Eye Exam    Visual Acuity (Snellen - Linear)      Right Left   Dist cc 20/40 20/40   Dist ph cc 20/30 20/30 -2   Correction:  Glasses       Tonometry (Tonopen, 10:00 AM)      Right Left   Pressure  18 13       Pupils      Dark Light Shape React APD   Right 4 3 Round Slow None   Left 4 3.5 Irregular Slow None       Visual Fields (Counting fingers)      Left Right    Full Full       Extraocular Movement      Right Left    Full, Ortho Full, Ortho       Neuro/Psych    Oriented x3:  Yes   Mood/Affect:  Normal       Dilation    Both eyes:  1.0% Mydriacyl, 2.5% Phenylephrine @ 10:00 AM        Slit Lamp and Fundus Exam    Slit Lamp Exam      Right Left   Lids/Lashes Telangiectasia, Dermatochalasis - upper lid Telangiectasia   Conjunctiva/Sclera Conjunctivochalasis White and quiet   Cornea Arcus, 1-2+ Punctate epithelial erosions Arcus, 1+ Punctate  epithelial erosions   Anterior Chamber Deep and quiet Deep and quiet   Iris Round and dilated Round and dilated   Lens PC IOL in good position, open PC PC IOL in good position, clear PC   Vitreous Posterior vitreous detachment syneresis, Posterior vitreous detachment       Fundus Exam      Right Left   Disc trace pallor, sharp rim Sharp rim, mild temporal PPA   C/D Ratio 0.2 0.2   Macula Blunted foveal reflex, mild Retinal pigment epithelial mottling, No heme or edema Blunted foveal reflex, cluster of IRH and exudate perifoveal -- improved, pigment clumping nasal to fovea    Vessels Vascular attenuation Vascular attenuation   Periphery Attached Attached, mid-zonal drusen, blot hemorrhage temporal periphery         Refraction    Wearing Rx      Sphere Cylinder Axis   Right [place] +1.25 007   Left -1.50 +2.00 177       Manifest Refraction      Sphere Cylinder Axis Dist VA   Right       Left -2.00 +2.00 177 20/30+2          IMAGING AND PROCEDURES  Imaging and Procedures for 09/29/17  OCT, Retina - OU - Both Eyes       Right Eye Quality was good. Central Foveal Thickness: 264. Progression has been stable. Findings include normal foveal contour, no IRF, no SRF, retinal drusen , epiretinal membrane (Mild ERM).   Left Eye Quality was good. Central Foveal Thickness: 259. Progression has improved. Findings include abnormal foveal contour, retinal drusen , outer retinal atrophy, intraretinal fluid, intraretinal hyper-reflective material, no SRF (Trace persistent cystic changes temporal fovea).   Notes *Images captured and stored on drive  Diagnosis / Impression:  OD: retinal drusen, mild ERM OS: exudative ARMD: trace persistent cystic changes  Clinical management:  See below  Abbreviations: NFP - Normal foveal profile. CME - cystoid macular edema. PED - pigment epithelial detachment. IRF - intraretinal fluid. SRF - subretinal fluid. EZ - ellipsoid zone. ERM - epiretinal  membrane. ORA - outer retinal atrophy. ORT - outer retinal tubulation. SRHM - subretinal hyper-reflective material                  ASSESSMENT/PLAN:    ICD-10-CM   1. Exudative age-related macular degeneration of left eye with active choroidal neovascularization ([place]) H35.3221   2. Intermediate stage nonexudative age-related macular degeneration of right eye H35.3112  3. Retinal edema H35.81 OCT, Retina - OU - Both Eyes  4. PCO (posterior capsular opacification), right H26.491   5. Pseudophakia of both eyes Z96.1     1,3. Exudative age related macular degeneration OS.    - most recent FA shows mild CNV, possible RAP lesion temporal macula -- mild interval decrease in leakage  - S/P IVA OS #1 (07.23.19), #2 (08.20.19), #3 (09.17.19), #4 (10.23.19), #5 (12.03.19)  - OCT today shows stable improvement in cystic changes at 14 wks post-IVA OS  - BCVA OS stable at 20/30-2  - discussed further treat and extend regimen to 10 wks vs holding treatment  - pt wishes to hold and follow up in 2-3 mos  - f/u 2-3 mos, DFE, OCT-- patient to call/return if vision worsens  2. Age related macular degeneration, non-exudative, right eye  - The incidence, anatomy, and pathology of dry AMD, risk of progression, and the AREDS and AREDS 2 study including smoking risks discussed with patient.  - continue amsler grid monitoring  4. PCO OD-  - S/P Yag Laser OD (07.10.19) - excellent opening in capsule - pt reports subjective improvement in vision OD - VA stable at 20/30  5. Pseudophakia OU-   - s/p CE/IOL OU  - beautiful surgeries, doing well  -stable, monitor   Ophthalmic Meds Ordered this visit:  No orders of the defined types were placed in this encounter.      Return 2-3 months, for DFE, OCT.  There are no Patient Instructions on file for this visit.   Explained the diagnoses, plan, and follow up with the patient and they expressed understanding.  Patient expressed understanding of  the importance of proper follow up care.   This document serves as a record of services personally performed by Gardiner Sleeper, MD, PhD. It was created on their behalf by Ernest Mallick, OA, an ophthalmic assistant. The creation of this record is the provider's dictation and/or activities during the visit.    Electronically signed by: Ernest Mallick, OA  03.09.2020 11:19 AM    Gardiner Sleeper, M.D., Ph.D. Diseases & Surgery of the Retina and Vitreous [facility]   I have reviewed the above documentation for accuracy and completeness, and I agree with the above. Gardiner Sleeper, M.D., Ph.D. 08/26/18 11:22 AM   Abbreviations: M myopia (nearsighted); A astigmatism; H hyperopia (farsighted); P presbyopia; Mrx spectacle prescription;  CTL contact lenses; OD right eye; OS left eye; OU both eyes  XT exotropia; ET esotropia; PEK punctate epithelial keratitis; PEE punctate epithelial erosions; DES dry eye syndrome; MGD meibomian gland dysfunction; ATs artificial tears; PFAT's preservative free artificial tears; [place] nuclear sclerotic cataract; PSC posterior subcapsular cataract; ERM epi-retinal membrane; PVD posterior vitreous detachment; RD retinal detachment; DM diabetes mellitus; DR diabetic retinopathy; NPDR non-proliferative diabetic retinopathy; PDR proliferative diabetic retinopathy; CSME clinically significant macular edema; DME diabetic macular edema; dbh dot blot hemorrhages; CWS cotton wool spot; POAG primary open angle glaucoma; C/D cup-to-disc ratio; HVF humphrey visual field; GVF goldmann visual field; OCT optical coherence tomography; IOP intraocular pressure; BRVO Branch retinal vein occlusion; CRVO central retinal vein occlusion; CRAO central retinal artery occlusion; BRAO branch retinal artery occlusion; RT retinal tear; SB scleral buckle; PPV pars plana vitrectomy; VH Vitreous hemorrhage; PRP panretinal laser photocoagulation; IVK intravitreal kenalog; VMT vitreomacular  traction; MH Macular hole;  NVD neovascularization of the disc; NVE neovascularization elsewhere; AREDS age related eye disease study; ARMD age related macular degeneration; POAG primary open  angle glaucoma; EBMD epithelial/anterior basement membrane dystrophy; ACIOL anterior chamber intraocular lens; IOL intraocular lens; PCIOL posterior chamber intraocular lens; Phaco/IOL phacoemulsification with intraocular lens placement; [place] photorefractive keratectomy; LASIK laser assisted in situ keratomileusis; HTN hypertension; DM diabetes mellitus; COPD chronic obstructive pulmonary disease

## 2018-08-23 ENCOUNTER — Encounter (INDEPENDENT_AMBULATORY_CARE_PROVIDER_SITE_OTHER): Payer: Self-pay | Admitting: Ophthalmology

## 2018-08-23 ENCOUNTER — Ambulatory Visit (INDEPENDENT_AMBULATORY_CARE_PROVIDER_SITE_OTHER): Payer: Medicare Other | Admitting: Ophthalmology

## 2018-08-23 ENCOUNTER — Other Ambulatory Visit: Payer: Self-pay

## 2018-08-23 DIAGNOSIS — H353112 Nonexudative age-related macular degeneration, right eye, intermediate dry stage: Secondary | ICD-10-CM

## 2018-08-23 DIAGNOSIS — H26491 Other secondary cataract, right eye: Secondary | ICD-10-CM

## 2018-08-23 DIAGNOSIS — H353221 Exudative age-related macular degeneration, left eye, with active choroidal neovascularization: Secondary | ICD-10-CM

## 2018-08-23 DIAGNOSIS — Z961 Presence of intraocular lens: Secondary | ICD-10-CM | POA: Diagnosis not present

## 2018-08-23 DIAGNOSIS — H3581 Retinal edema: Secondary | ICD-10-CM

## 2018-08-26 ENCOUNTER — Encounter (INDEPENDENT_AMBULATORY_CARE_PROVIDER_SITE_OTHER): Payer: Self-pay | Admitting: Ophthalmology

## 2018-08-28 DIAGNOSIS — I10 Essential (primary) hypertension: Secondary | ICD-10-CM | POA: Diagnosis not present

## 2018-08-28 DIAGNOSIS — M159 Polyosteoarthritis, unspecified: Secondary | ICD-10-CM | POA: Diagnosis not present

## 2018-08-28 DIAGNOSIS — E7800 Pure hypercholesterolemia, unspecified: Secondary | ICD-10-CM | POA: Diagnosis not present

## 2018-09-26 DIAGNOSIS — I10 Essential (primary) hypertension: Secondary | ICD-10-CM | POA: Diagnosis not present

## 2018-09-26 DIAGNOSIS — M159 Polyosteoarthritis, unspecified: Secondary | ICD-10-CM | POA: Diagnosis not present

## 2018-09-26 DIAGNOSIS — E7800 Pure hypercholesterolemia, unspecified: Secondary | ICD-10-CM | POA: Diagnosis not present

## 2018-10-22 NOTE — Progress Notes (Signed)
[facility] Note  10/23/2018     CHIEF COMPLAINT Patient presents for Retina Follow Up   HISTORY OF PRESENT ILLNESS: Jacob Oliver is a 85 y.o. male who presents to the clinic today for:   HPI    Retina Follow Up    Patient presents with  Wet AMD.  In left eye.  Severity is moderate.  Duration of 2 months.  I, the attending physician,  performed the HPI with the patient and updated documentation appropriately.       Last edited by Bernarda Caffey, MD on 10/23/2018 10:19 AM. (History)    Patient states he has been seeing very well  Referring physician: No referring provider defined for this encounter.  HISTORICAL INFORMATION:   Selected notes from the MEDICAL RECORD NUMBER Referred by Dr. Leander Rams for concern of Exudative AMD OS;  LEE- 02.21.19 ([facility]) [BCVA OD: 20/30 OS:20/20] Ocular Hx- pseudophakia OU ([facility] 10+ years ago) PMH-     CURRENT MEDICATIONS: No current outpatient medications on file. (Ophthalmic Drugs)   No current facility-administered medications for this visit.  (Ophthalmic Drugs)   Current Outpatient Medications (Other)  Medication Sig  . Calcium Carb-Ergocalciferol 500-200 MG-UNIT TABS Take by mouth.  Mariane Baumgarten Sodium 150 MG/15ML syrup Take 50 mg by mouth.  . ferrous sulfate 220 (44 Fe) MG/5ML solution Take 220 mg by mouth.  Marland Kitchen omeprazole (PRILOSEC) 20 MG capsule Take 20 mg by mouth.  . vitamin B-12 (CYANOCOBALAMIN) 500 MCG tablet Take by mouth.  . Vitamin D, Ergocalciferol, (DRISDOL) 50000 units CAPS capsule Take by mouth.   Current Facility-Administered Medications (Other)  Medication Route  . Bevacizumab (AVASTIN) SOLN 1.25 mg Intravitreal  . Bevacizumab (AVASTIN) SOLN 1.25 mg Intravitreal  . Bevacizumab (AVASTIN) SOLN 1.25 mg Intravitreal  . Bevacizumab (AVASTIN) SOLN 1.25 mg Intravitreal  . Bevacizumab (AVASTIN) SOLN 1.25 mg Intravitreal      REVIEW OF SYSTEMS: ROS    Positive for: HENT, Eyes    Negative for: Constitutional, Gastrointestinal, Neurological, Skin, Genitourinary, Musculoskeletal, Endocrine, Cardiovascular, Respiratory, Psychiatric, Allergic/Imm, Heme/Lymph   Last edited by Roselee Nova D on 10/23/2018  9:36 AM. (History)       ALLERGIES No Known Allergies  PAST MEDICAL HISTORY Past Medical History:  Diagnosis Date  . Cataract    Past Surgical History:  Procedure Laterality Date  . CATARACT EXTRACTION  2009/2002  . EYE SURGERY      FAMILY HISTORY Family History  Problem Relation Age of Onset  . Macular degeneration Mother   . Cataracts Mother   . Macular degeneration Sister     SOCIAL HISTORY Social History   Tobacco Use  . Smoking status: Former Research scientist (life sciences)  . Smokeless tobacco: Never Used  Substance Use Topics  . Alcohol use: Not on file  . Drug use: Not on file         OPHTHALMIC EXAM:  Base Eye Exam    Visual Acuity (Snellen - Linear)      Right Left   Dist cc 20/30 -1 20/25   Dist ph cc NI NI   Correction:  Glasses       Tonometry (Tonopen, 9:45 AM)      Right Left   Pressure 17 19       Pupils      Dark Light Shape React APD   Right 4 3 Round Slow None   Left 4 3.5 Irregular Slow None       Visual Fields (  Counting fingers)      Left Right    Full Full       Extraocular Movement      Right Left    Full, Ortho Full, Ortho       Neuro/Psych    Oriented x3:  Yes   Mood/Affect:  Normal       Dilation    Both eyes:  1.0% Mydriacyl, 2.5% Phenylephrine @ 9:45 AM        Slit Lamp and Fundus Exam    Slit Lamp Exam      Right Left   Lids/Lashes Telangiectasia, Dermatochalasis - upper lid Telangiectasia   Conjunctiva/Sclera Conjunctivochalasis White and quiet   Cornea Arcus, 1+ Punctate epithelial erosions, trace Descemet's folds Arcus, 1+ inferior Punctate epithelial erosions, mild irregular epithelium inferiorly   Anterior Chamber Deep and quiet Deep and quiet   Iris Round and moderately dilated Round and  dilated   Lens PC IOL in good position, open PC PC IOL in good position, clear PC   Vitreous Vitreous syneresis, Posterior vitreous detachment syneresis, Posterior vitreous detachment       Fundus Exam      Right Left   Disc Pink and Sharp Pink and Sharp   C/D Ratio 0.2 0.2   Macula Flat, good foveal reflex, mild Retinal pigment epithelial mottling, No heme or edema Blunted foveal reflex, cluster of IRH and exudate perifoveal -- improved, pigment clumping nasal to fovea    Vessels Vascular attenuation Vascular attenuation   Periphery Attached Attached, mid-zonal drusen, blot hemorrhage temporal periphery           IMAGING AND PROCEDURES  Imaging and Procedures for 09/29/17  OCT, Retina - OU - Both Eyes       Right Eye Quality was good. Central Foveal Thickness: 265. Progression has been stable. Findings include normal foveal contour, no IRF, no SRF, retinal drusen , epiretinal membrane (Mild ERM).   Left Eye Quality was good. Central Foveal Thickness: 263. Progression has improved. Findings include abnormal foveal contour, retinal drusen , outer retinal atrophy, intraretinal fluid, intraretinal hyper-reflective material, no SRF (Trace persistent cystic changes temporal fovea -- slightly improved).   Notes *Images captured and stored on drive  Diagnosis / Impression:  OD: retinal drusen, mild ERM OS: exudative ARMD: trace persistent cystic changes -- slightly improved  Clinical management:  See below  Abbreviations: NFP - Normal foveal profile. CME - cystoid macular edema. PED - pigment epithelial detachment. IRF - intraretinal fluid. SRF - subretinal fluid. EZ - ellipsoid zone. ERM - epiretinal membrane. ORA - outer retinal atrophy. ORT - outer retinal tubulation. SRHM - subretinal hyper-reflective material                  ASSESSMENT/PLAN:    ICD-10-CM   1. Exudative age-related macular degeneration of left eye with active choroidal neovascularization ([place])  H35.3221   2. Intermediate stage nonexudative age-related macular degeneration of right eye H35.3112   3. Retinal edema H35.81 OCT, Retina - OU - Both Eyes  4. PCO (posterior capsular opacification), right H26.491   5. Pseudophakia of both eyes Z96.1     1,3. Exudative age related macular degeneration OS.    - S/P IVA OS #1 (07.23.19), #2 (08.20.19), #3 (09.17.19), #4 (10.23.19), #5 (12.03.19)  - most recent FA (05/2018) shows mild CNV, possible RAP lesion temporal macula -- mild interval decrease in leakage  - OCT today shows stable improvement in cystic changes now 5 mos sinc last IVA OS  -  BCVA OS slightly improved to 20/25-2  - recommend continuing to hold treatment  - pt wishes to hold and follow up in 3-4 mos  - f/u 3-4 mos, DFE, OCT-- patient to call/return if vision worsens  2. Age related macular degeneration, non-exudative, right eye  - The incidence, anatomy, and pathology of dry AMD, risk of progression, and the AREDS and AREDS 2 study including smoking risks discussed with patient.  - continue amsler grid monitoring  4. PCO OD-   - S/P Yag Laser OD (07.10.19)  - excellent opening in capsule  - pt reports subjective improvement in vision OD  - VA stable at 20/30  5. Pseudophakia OU-   - s/p CE/IOL OU  - beautiful surgeries, doing well  -stable, monitor   Ophthalmic Meds Ordered this visit:  No orders of the defined types were placed in this encounter.      Return for 3-4 mos, Dilated Exam, OCT.  There are no Patient Instructions on file for this visit.   Explained the diagnoses, plan, and follow up with the patient and they expressed understanding.  Patient expressed understanding of the importance of proper follow up care.   This document serves as a record of services personally performed by Gardiner Sleeper, MD, PhD. It was created on their behalf by Ernest Mallick, OA, an ophthalmic assistant. The creation of this record is the provider's dictation and/or  activities during the visit.    Electronically signed by: Ernest Mallick, OA  05.10.2020 10:44 AM    Gardiner Sleeper, M.D., Ph.D. Diseases & Surgery of the Retina and Vitreous [facility]  I have reviewed the above documentation for accuracy and completeness, and I agree with the above. Gardiner Sleeper, M.D., Ph.D. 10/23/18 10:46 AM     Abbreviations: M myopia (nearsighted); A astigmatism; H hyperopia (farsighted); P presbyopia; Mrx spectacle prescription;  CTL contact lenses; OD right eye; OS left eye; OU both eyes  XT exotropia; ET esotropia; PEK punctate epithelial keratitis; PEE punctate epithelial erosions; DES dry eye syndrome; MGD meibomian gland dysfunction; ATs artificial tears; PFAT's preservative free artificial tears; [place] nuclear sclerotic cataract; PSC posterior subcapsular cataract; ERM epi-retinal membrane; PVD posterior vitreous detachment; RD retinal detachment; DM diabetes mellitus; DR diabetic retinopathy; NPDR non-proliferative diabetic retinopathy; PDR proliferative diabetic retinopathy; CSME clinically significant macular edema; DME diabetic macular edema; dbh dot blot hemorrhages; CWS cotton wool spot; POAG primary open angle glaucoma; C/D cup-to-disc ratio; HVF humphrey visual field; GVF goldmann visual field; OCT optical coherence tomography; IOP intraocular pressure; BRVO Branch retinal vein occlusion; CRVO central retinal vein occlusion; CRAO central retinal artery occlusion; BRAO branch retinal artery occlusion; RT retinal tear; SB scleral buckle; PPV pars plana vitrectomy; VH Vitreous hemorrhage; PRP panretinal laser photocoagulation; IVK intravitreal kenalog; VMT vitreomacular traction; MH Macular hole;  NVD neovascularization of the disc; NVE neovascularization elsewhere; AREDS age related eye disease study; ARMD age related macular degeneration; POAG primary open angle glaucoma; EBMD epithelial/anterior basement membrane dystrophy; ACIOL anterior  chamber intraocular lens; IOL intraocular lens; PCIOL posterior chamber intraocular lens; Phaco/IOL phacoemulsification with intraocular lens placement; [place] photorefractive keratectomy; LASIK laser assisted in situ keratomileusis; HTN hypertension; DM diabetes mellitus; COPD chronic obstructive pulmonary disease

## 2018-10-23 ENCOUNTER — Other Ambulatory Visit: Payer: Self-pay

## 2018-10-23 ENCOUNTER — Encounter (INDEPENDENT_AMBULATORY_CARE_PROVIDER_SITE_OTHER): Payer: Self-pay | Admitting: Ophthalmology

## 2018-10-23 ENCOUNTER — Ambulatory Visit (INDEPENDENT_AMBULATORY_CARE_PROVIDER_SITE_OTHER): Payer: Medicare Other | Admitting: Ophthalmology

## 2018-10-23 DIAGNOSIS — H3581 Retinal edema: Secondary | ICD-10-CM | POA: Diagnosis not present

## 2018-10-23 DIAGNOSIS — H353112 Nonexudative age-related macular degeneration, right eye, intermediate dry stage: Secondary | ICD-10-CM | POA: Diagnosis not present

## 2018-10-23 DIAGNOSIS — Z961 Presence of intraocular lens: Secondary | ICD-10-CM | POA: Diagnosis not present

## 2018-10-23 DIAGNOSIS — H353221 Exudative age-related macular degeneration, left eye, with active choroidal neovascularization: Secondary | ICD-10-CM | POA: Diagnosis not present

## 2018-10-23 DIAGNOSIS — H26491 Other secondary cataract, right eye: Secondary | ICD-10-CM | POA: Diagnosis not present

## 2018-10-26 DIAGNOSIS — I10 Essential (primary) hypertension: Secondary | ICD-10-CM | POA: Diagnosis not present

## 2018-10-26 DIAGNOSIS — M159 Polyosteoarthritis, unspecified: Secondary | ICD-10-CM | POA: Diagnosis not present

## 2018-10-26 DIAGNOSIS — E7800 Pure hypercholesterolemia, unspecified: Secondary | ICD-10-CM | POA: Diagnosis not present

## 2018-11-27 DIAGNOSIS — E7800 Pure hypercholesterolemia, unspecified: Secondary | ICD-10-CM | POA: Diagnosis not present

## 2018-11-27 DIAGNOSIS — I10 Essential (primary) hypertension: Secondary | ICD-10-CM | POA: Diagnosis not present

## 2018-11-27 DIAGNOSIS — M159 Polyosteoarthritis, unspecified: Secondary | ICD-10-CM | POA: Diagnosis not present

## 2018-12-26 DIAGNOSIS — M159 Polyosteoarthritis, unspecified: Secondary | ICD-10-CM | POA: Diagnosis not present

## 2018-12-26 DIAGNOSIS — I10 Essential (primary) hypertension: Secondary | ICD-10-CM | POA: Diagnosis not present

## 2018-12-26 DIAGNOSIS — E7800 Pure hypercholesterolemia, unspecified: Secondary | ICD-10-CM | POA: Diagnosis not present

## 2019-01-19 DIAGNOSIS — I10 Essential (primary) hypertension: Secondary | ICD-10-CM | POA: Diagnosis not present

## 2019-01-19 DIAGNOSIS — E7800 Pure hypercholesterolemia, unspecified: Secondary | ICD-10-CM | POA: Diagnosis not present

## 2019-01-19 DIAGNOSIS — M159 Polyosteoarthritis, unspecified: Secondary | ICD-10-CM | POA: Diagnosis not present

## 2019-02-25 NOTE — Progress Notes (Signed)
[facility] Note  02/26/2019     CHIEF COMPLAINT Patient presents for Retina Follow Up   HISTORY OF PRESENT ILLNESS: Jacob Oliver is a 85 y.o. male who presents to the clinic today for:   HPI    Retina Follow Up    Patient presents with  Wet AMD.  In left eye.  This started months ago.  Severity is moderate.  Duration of 4 months.  Since onset it is stable.  I, the attending physician,  performed the HPI with the patient and updated documentation appropriately.          Comments    85 y/o male pt here for 4 mo f/u for exu ARMD OS.  No change in [place] OU.  Denies pain, flashes, floaters.  Visine prn OU.       Last edited by Bernarda Caffey, MD on 02/26/2019  9:16 PM. (History)    Patient states his vision is  "pretty good"  Referring physician: No referring provider defined for this encounter.  HISTORICAL INFORMATION:   Selected notes from the MEDICAL RECORD NUMBER Referred by Dr. Leander Rams for concern of Exudative AMD OS;  LEE- 02.21.19 ([facility]) [BCVA OD: 20/30 OS:20/20] Ocular Hx- pseudophakia OU ([facility] 10+ years ago) PMH-     CURRENT MEDICATIONS: No current outpatient medications on file. (Ophthalmic Drugs)   No current facility-administered medications for this visit.  (Ophthalmic Drugs)   Current Outpatient Medications (Other)  Medication Sig  . Calcium Carb-Ergocalciferol 500-200 MG-UNIT TABS Take by mouth.  Mariane Baumgarten Sodium 150 MG/15ML syrup Take 50 mg by mouth.  . ferrous sulfate 220 (44 Fe) MG/5ML solution Take 220 mg by mouth.  Marland Kitchen omeprazole (PRILOSEC) 20 MG capsule Take 20 mg by mouth.  . vitamin B-12 (CYANOCOBALAMIN) 500 MCG tablet Take by mouth.  . Vitamin D, Ergocalciferol, (DRISDOL) 50000 units CAPS capsule Take by mouth.   Current Facility-Administered Medications (Other)  Medication Route  . Bevacizumab (AVASTIN) SOLN 1.25 mg Intravitreal  . Bevacizumab (AVASTIN) SOLN 1.25 mg Intravitreal  . Bevacizumab  (AVASTIN) SOLN 1.25 mg Intravitreal  . Bevacizumab (AVASTIN) SOLN 1.25 mg Intravitreal  . Bevacizumab (AVASTIN) SOLN 1.25 mg Intravitreal      REVIEW OF SYSTEMS: ROS    Positive for: Eyes   Negative for: Constitutional, Gastrointestinal, Neurological, Skin, Genitourinary, Musculoskeletal, HENT, Endocrine, Cardiovascular, Respiratory, Psychiatric, Allergic/Imm, Heme/Lymph   Last edited by Matthew Folks, COA on 02/26/2019 10:10 AM. (History)       ALLERGIES No Known Allergies  PAST MEDICAL HISTORY Past Medical History:  Diagnosis Date  . Macular degeneration    Wet OS, Dry OD   Past Surgical History:  Procedure Laterality Date  . CATARACT EXTRACTION Bilateral 2009/2002  . EYE SURGERY      FAMILY HISTORY Family History  Problem Relation Age of Onset  . Macular degeneration Mother   . Cataracts Mother   . Macular degeneration Sister     SOCIAL HISTORY Social History   Tobacco Use  . Smoking status: Former Research scientist (life sciences)  . Smokeless tobacco: Never Used  Substance Use Topics  . Alcohol use: Not on file  . Drug use: Not on file         OPHTHALMIC EXAM:  Base Eye Exam    Visual Acuity (Snellen - Linear)      Right Left   Dist cc 20/30 20/25   Dist ph cc NI NI   Correction: Glasses  Tonometry (Tonopen, 10:12 AM)      Right Left   Pressure 16 18       Pupils      Dark Light Shape React APD   Right 4 3 Round Slow None   Left 4 3.5 Irregular Slow None       Visual Fields (Counting fingers)      Left Right    Full Full       Extraocular Movement      Right Left    Full, Ortho Full, Ortho       Neuro/Psych    Oriented x3: Yes   Mood/Affect: Normal       Dilation    Both eyes: 1.0% Mydriacyl, 2.5% Phenylephrine @ 10:12 AM        Slit Lamp and Fundus Exam    Slit Lamp Exam      Right Left   Lids/Lashes Telangiectasia, Dermatochalasis - upper lid Telangiectasia   Conjunctiva/Sclera Conjunctivochalasis White and quiet   Cornea Arcus, 1+  Punctate epithelial erosions, trace Descemet's folds Arcus, 1+ inferior Punctate epithelial erosions, mild irregular epithelium inferiorly   Anterior Chamber Deep and quiet Deep and quiet   Iris Round and moderately dilated Round and dilated   Lens PC IOL in good position, open PC PC IOL in good position, clear PC   Vitreous Vitreous syneresis, Posterior vitreous detachment syneresis, Posterior vitreous detachment       Fundus Exam      Right Left   Disc Pink and Sharp Pink and Sharp   C/D Ratio 0.2 0.2   Macula Flat, good foveal reflex, mild Retinal pigment epithelial mottling and clumping, No heme or edema Blunted foveal reflex, focal MA's temporal to fovea -- slightly increased, cluster of IRH and exudate temporal perifovea, pigment clumping nasal to fovea   Vessels Vascular attenuation Vascular attenuation   Periphery Attached Attached, mid-zonal drusen, blot hemorrhage temporal periphery           IMAGING AND PROCEDURES  Imaging and Procedures for 09/29/17  OCT, Retina - OU - Both Eyes       Right Eye Quality was good. Central Foveal Thickness: 262. Progression has been stable. Findings include normal foveal contour, no IRF, no SRF, retinal drusen , epiretinal membrane (Mild ERM).   Left Eye Quality was good. Central Foveal Thickness: 273. Progression has worsened. Findings include abnormal foveal contour, retinal drusen , outer retinal atrophy, intraretinal fluid, intraretinal hyper-reflective material, no SRF (Interval increase in IRF/IRHM temporal macula).   Notes *Images captured and stored on drive  Diagnosis / Impression:  OD: retinal drusen, mild ERM OS: exudative ARMD; Interval increase in IRF/IRHM temporal macula  Clinical management:  See below  Abbreviations: NFP - Normal foveal profile. CME - cystoid macular edema. PED - pigment epithelial detachment. IRF - intraretinal fluid. SRF - subretinal fluid. EZ - ellipsoid zone. ERM - epiretinal membrane. ORA -  outer retinal atrophy. ORT - outer retinal tubulation. SRHM - subretinal hyper-reflective material                  ASSESSMENT/PLAN:    ICD-10-CM   1. Exudative age-related macular degeneration of left eye with active choroidal neovascularization ([employer])  H35.3221   2. Intermediate stage nonexudative age-related macular degeneration of right eye  H35.3112   3. Retinal edema  H35.81 OCT, Retina - OU - Both Eyes  4. PCO (posterior capsular opacification), right  H26.491   5. Pseudophakia of both eyes  Z96.1  1,3. Exudative age related macular degeneration OS.    - S/P IVA OS #1 (07.23.19), #2 (08.20.19), #3 (09.17.19), #4 (10.23.19), #5 (12.03.19)  - most recent FA (05/2018) shows mild CNV, possible RAP lesion temporal macula -- mild interval decrease in leakage  - OCT today shows interval increase in IRF/IRHM now 9 mos sinc last IVA OS  - BCVA OS remains good and stable at 20/25-2  - recommend IVA OS #6 today, 09.14.20  - pt wishes to defer for 4 more weeks  - f/u 4 weeks, DFE, OCT, possible injection OS-- patient to call/return if vision worsens  2. Age related macular degeneration, non-exudative, right eye  - The incidence, anatomy, and pathology of dry AMD, risk of progression, and the AREDS and AREDS 2 study including smoking risks discussed with patient.  - continue amsler grid monitoring  4. Pseudophakia OU-   - s/p CE/IOL OU  - beautiful surgeries, doing well  - stable, monitor  5. PCO OD-   - S/P Yag Laser OD (07.10.19)  - excellent opening in capsule  - VA stable at 20/30     Ophthalmic Meds Ordered this visit:  No orders of the defined types were placed in this encounter.      Return in about 4 weeks (around 03/26/2019) for f/u exu ARMD OS, DFE, OCT.  There are no Patient Instructions on file for this visit.   Explained the diagnoses, plan, and follow up with the patient and they expressed understanding.  Patient expressed understanding of the  importance of proper follow up care.   This document serves as a record of services personally performed by Gardiner Sleeper, MD, PhD. It was created on their behalf by Ernest Mallick, OA, an ophthalmic assistant. The creation of this record is the provider's dictation and/or activities during the visit.    Electronically signed by: Ernest Mallick, OA  09.13.2020 9:17 PM     Gardiner Sleeper, M.D., Ph.D. Diseases & Surgery of the Retina and Vitreous [facility]   I have reviewed the above documentation for accuracy and completeness, and I agree with the above. Gardiner Sleeper, M.D., Ph.D. 02/26/19 9:19 PM     Abbreviations: M myopia (nearsighted); A astigmatism; H hyperopia (farsighted); P presbyopia; Mrx spectacle prescription;  CTL contact lenses; OD right eye; OS left eye; OU both eyes  XT exotropia; ET esotropia; PEK punctate epithelial keratitis; PEE punctate epithelial erosions; DES dry eye syndrome; MGD meibomian gland dysfunction; ATs artificial tears; PFAT's preservative free artificial tears; [place] nuclear sclerotic cataract; PSC posterior subcapsular cataract; ERM epi-retinal membrane; PVD posterior vitreous detachment; RD retinal detachment; DM diabetes mellitus; DR diabetic retinopathy; NPDR non-proliferative diabetic retinopathy; PDR proliferative diabetic retinopathy; CSME clinically significant macular edema; DME diabetic macular edema; dbh dot blot hemorrhages; CWS cotton wool spot; POAG primary open angle glaucoma; C/D cup-to-disc ratio; HVF humphrey visual field; GVF goldmann visual field; OCT optical coherence tomography; IOP intraocular pressure; BRVO Branch retinal vein occlusion; CRVO central retinal vein occlusion; CRAO central retinal artery occlusion; BRAO branch retinal artery occlusion; RT retinal tear; SB scleral buckle; PPV pars plana vitrectomy; VH Vitreous hemorrhage; PRP panretinal laser photocoagulation; IVK intravitreal kenalog; VMT vitreomacular  traction; MH Macular hole;  NVD neovascularization of the disc; NVE neovascularization elsewhere; AREDS age related eye disease study; ARMD age related macular degeneration; POAG primary open angle glaucoma; EBMD epithelial/anterior basement membrane dystrophy; ACIOL anterior chamber intraocular lens; IOL intraocular lens; PCIOL posterior chamber intraocular lens; Phaco/IOL phacoemulsification  with intraocular lens placement; [place] photorefractive keratectomy; LASIK laser assisted in situ keratomileusis; HTN hypertension; DM diabetes mellitus; COPD chronic obstructive pulmonary disease

## 2019-02-26 ENCOUNTER — Encounter (INDEPENDENT_AMBULATORY_CARE_PROVIDER_SITE_OTHER): Payer: Self-pay | Admitting: Ophthalmology

## 2019-02-26 ENCOUNTER — Other Ambulatory Visit: Payer: Self-pay

## 2019-02-26 ENCOUNTER — Ambulatory Visit (INDEPENDENT_AMBULATORY_CARE_PROVIDER_SITE_OTHER): Payer: Medicare Other | Admitting: Ophthalmology

## 2019-02-26 DIAGNOSIS — H26491 Other secondary cataract, right eye: Secondary | ICD-10-CM | POA: Diagnosis not present

## 2019-02-26 DIAGNOSIS — H353112 Nonexudative age-related macular degeneration, right eye, intermediate dry stage: Secondary | ICD-10-CM

## 2019-02-26 DIAGNOSIS — H3581 Retinal edema: Secondary | ICD-10-CM

## 2019-02-26 DIAGNOSIS — Z961 Presence of intraocular lens: Secondary | ICD-10-CM

## 2019-02-26 DIAGNOSIS — H353221 Exudative age-related macular degeneration, left eye, with active choroidal neovascularization: Secondary | ICD-10-CM

## 2019-03-16 DIAGNOSIS — E7800 Pure hypercholesterolemia, unspecified: Secondary | ICD-10-CM | POA: Diagnosis not present

## 2019-03-16 DIAGNOSIS — M159 Polyosteoarthritis, unspecified: Secondary | ICD-10-CM | POA: Diagnosis not present

## 2019-03-16 DIAGNOSIS — I10 Essential (primary) hypertension: Secondary | ICD-10-CM | POA: Diagnosis not present

## 2019-03-20 NOTE — Progress Notes (Signed)
[facility] Note  03/26/2019     CHIEF COMPLAINT Patient presents for Retina Follow Up   HISTORY OF PRESENT ILLNESS: Jacob Oliver is a 85 y.o. male who presents to the clinic today for:   HPI    Retina Follow Up    Patient presents with  Wet AMD.  In left eye.  Severity is moderate.  Duration of 4 weeks.  Since onset it is stable.  I, the attending physician,  performed the HPI with the patient and updated documentation appropriately.          Comments    Patient states vision the same OU.       Last edited by Bernarda Caffey, MD on 03/26/2019 12:15 PM. (History)    Patient states he feels like his left eye vision is getting better  Referring physician: Leticia Clas, [address],  [place] [address]  HISTORICAL INFORMATION:   Selected notes from the MEDICAL RECORD NUMBER Referred by Dr. Leander Rams for concern of Exudative AMD OS;  LEE- 02.21.19 ([facility]) [BCVA OD: 20/30 OS:20/20] Ocular Hx- pseudophakia OU ([facility] 10+ years ago) PMH-     CURRENT MEDICATIONS: No current outpatient medications on file. (Ophthalmic Drugs)   No current facility-administered medications for this visit.  (Ophthalmic Drugs)   Current Outpatient Medications (Other)  Medication Sig  . Calcium Carb-Ergocalciferol 500-200 MG-UNIT TABS Take by mouth.  Mariane Baumgarten Sodium 150 MG/15ML syrup Take 50 mg by mouth.  . ferrous sulfate 220 (44 Fe) MG/5ML solution Take 220 mg by mouth.  Marland Kitchen omeprazole (PRILOSEC) 20 MG capsule Take 20 mg by mouth.  . vitamin B-12 (CYANOCOBALAMIN) 500 MCG tablet Take by mouth.  . Vitamin D, Ergocalciferol, (DRISDOL) 50000 units CAPS capsule Take by mouth.   Current Facility-Administered Medications (Other)  Medication Route  . Bevacizumab (AVASTIN) SOLN 1.25 mg Intravitreal  . Bevacizumab (AVASTIN) SOLN 1.25 mg Intravitreal  . Bevacizumab (AVASTIN) SOLN 1.25 mg Intravitreal  . Bevacizumab (AVASTIN) SOLN 1.25 mg  Intravitreal  . Bevacizumab (AVASTIN) SOLN 1.25 mg Intravitreal      REVIEW OF SYSTEMS: ROS    Positive for: HENT, Eyes   Negative for: Constitutional, Gastrointestinal, Neurological, Skin, Genitourinary, Musculoskeletal, Endocrine, Cardiovascular, Respiratory, Psychiatric, Allergic/Imm, Heme/Lymph   Last edited by Roselee Nova D, COT on 03/26/2019  9:57 AM. (History)       ALLERGIES No Known Allergies  PAST MEDICAL HISTORY Past Medical History:  Diagnosis Date  . Macular degeneration    Wet OS, Dry OD   Past Surgical History:  Procedure Laterality Date  . CATARACT EXTRACTION Bilateral 2009/2002  . EYE SURGERY      FAMILY HISTORY Family History  Problem Relation Age of Onset  . Macular degeneration Mother   . Cataracts Mother   . Macular degeneration Sister     SOCIAL HISTORY Social History   Tobacco Use  . Smoking status: Former Research scientist (life sciences)  . Smokeless tobacco: Never Used  Substance Use Topics  . Alcohol use: Not on file  . Drug use: Not on file         OPHTHALMIC EXAM:  Base Eye Exam    Visual Acuity (Snellen - Linear)      Right Left   Dist cc 20/40 +2 20/40 +2   Dist ph cc NI 20/30 +1       Tonometry (Tonopen, 10:08 AM)      Right Left   Pressure 19 15  Pupils      Dark Light Shape React APD   Right 5 4.5 Round Slow None   Left 4 3.5 Round Slow None       Visual Fields (Counting fingers)      Left Right    Full Full       Extraocular Movement      Right Left    Full, Ortho Full, Ortho       Neuro/Psych    Oriented x3: Yes   Mood/Affect: Normal       Dilation    Both eyes: 1.0% Mydriacyl, 2.5% Phenylephrine @ 10:08 AM        Slit Lamp and Fundus Exam    Slit Lamp Exam      Right Left   Lids/Lashes Telangiectasia, Dermatochalasis - upper lid Telangiectasia   Conjunctiva/Sclera Conjunctivochalasis White and quiet   Cornea Arcus, 1+ Punctate epithelial erosions, trace Descemet's folds Arcus, 1+ inferior Punctate  epithelial erosions, mild irregular epithelium inferiorly   Anterior Chamber Deep and quiet Deep and quiet   Iris Round and moderately dilated Round and dilated   Lens PC IOL in good position, open PC PC IOL in good position, clear PC   Vitreous Vitreous syneresis, Posterior vitreous detachment syneresis, Posterior vitreous detachment       Fundus Exam      Right Left   Disc Pink and Sharp Pink and Sharp   C/D Ratio 0.2 0.2   Macula Flat, good foveal reflex, mild Retinal pigment epithelial mottling and clumping, No heme or edema Blunted foveal reflex, focal MA's temporal to fovea, slight improvement in cystic changes, pigment clumping nasal to fovea   Vessels Vascular attenuation Vascular attenuation   Periphery Attached Attached, mid-zonal drusen, blot hemorrhage temporal periphery         Refraction    Wearing Rx      Sphere Cylinder Axis   Right [place] +1.25 007   Left -1.50 +2.00 177       Manifest Refraction      Sphere Cylinder Axis Dist VA   Right -0.50 +0.75 010 20/30-1   Left -1.50 +2.00 175 20/20-1          IMAGING AND PROCEDURES  Imaging and Procedures for 09/29/17  OCT, Retina - OU - Both Eyes       Right Eye Quality was good. Central Foveal Thickness: 260. Progression has been stable. Findings include normal foveal contour, no IRF, no SRF, retinal drusen , epiretinal membrane (Mild ERM).   Left Eye Quality was good. Central Foveal Thickness: 268. Progression has improved. Findings include abnormal foveal contour, retinal drusen , outer retinal atrophy, intraretinal fluid, intraretinal hyper-reflective material, no SRF (Interval Improvement in IRF/IRHM temporal macula).   Notes *Images captured and stored on drive  Diagnosis / Impression:  OD: retinal drusen, mild ERM OS: exudative ARMD; Interval improvement in IRF/IRHM temporal macula  Clinical management:  See below  Abbreviations: NFP - Normal foveal profile. CME - cystoid macular edema. PED -  pigment epithelial detachment. IRF - intraretinal fluid. SRF - subretinal fluid. EZ - ellipsoid zone. ERM - epiretinal membrane. ORA - outer retinal atrophy. ORT - outer retinal tubulation. SRHM - subretinal hyper-reflective material                  ASSESSMENT/PLAN:    ICD-10-CM   1. Exudative age-related macular degeneration of left eye with active choroidal neovascularization ([place])  H35.3221 CANCELED: Intravitreal Injection, Pharmacologic Agent - OS - Left Eye  2. Intermediate stage nonexudative age-related macular degeneration of right eye  H35.3112   3. Retinal edema  H35.81 OCT, Retina - OU - Both Eyes  4. Pseudophakia of both eyes  Z96.1   5. PCO (posterior capsular opacification), right  H26.491     1,3. Exudative age related macular degeneration OS.    - S/P IVA OS #1 (07.23.19), #2 (08.20.19), #3 (09.17.19), #4 (10.23.19), #5 (12.03.19), #6 (09.14.20)  - most recent FA (05/2018) shows mild CNV, possible RAP lesion temporal macula -- mild interval decrease in leakage  - OCT today shows interval decrease in IRF/IRHM compared to last visit  - now 13 mos sinc last IVA OS  - BCVA OS slightly decreased to 20/30+1 from 20/25-2, but MRx OS 20/20-1  - recommend continued observation  - f/u 4-6 weeks, DFE, OCT, possible injection OS-- patient to call/return if vision worsens  2. Age related macular degeneration, non-exudative, right eye  - The incidence, anatomy, and pathology of dry AMD, risk of progression, and the AREDS and AREDS 2 study including smoking risks discussed with patient.  - continue amsler grid monitoring  4. Pseudophakia OU-   - s/p CE/IOL OU  - beautiful surgeries, doing well  - stable, monitor  5. PCO OD-   - S/P Yag Laser OD (07.10.19)  - excellent opening in capsule  - VA stable at 20/30     Ophthalmic Meds Ordered this visit:  No orders of the defined types were placed in this encounter.      Return for f/u 4-6 weeks, exu ARMD OS, DFE,  OCT.  There are no Patient Instructions on file for this visit.   Explained the diagnoses, plan, and follow up with the patient and they expressed understanding.  Patient expressed understanding of the importance of proper follow up care.     This document serves as a record of services personally performed by Gardiner Sleeper, MD, PhD. It was created on their behalf by Ernest Mallick, OA, an ophthalmic assistant. The creation of this record is the provider's dictation and/or activities during the visit.    Electronically signed by: Ernest Mallick, OA 10.12.2020 12:42 PM   Gardiner Sleeper, M.D., Ph.D. Diseases & Surgery of the Retina and Vitreous [facility]  I have reviewed the above documentation for accuracy and completeness, and I agree with the above. Gardiner Sleeper, M.D., Ph.D. 03/26/19 12:42 PM    Abbreviations: M myopia (nearsighted); A astigmatism; H hyperopia (farsighted); P presbyopia; Mrx spectacle prescription;  CTL contact lenses; OD right eye; OS left eye; OU both eyes  XT exotropia; ET esotropia; PEK punctate epithelial keratitis; PEE punctate epithelial erosions; DES dry eye syndrome; MGD meibomian gland dysfunction; ATs artificial tears; PFAT's preservative free artificial tears; [place] nuclear sclerotic cataract; PSC posterior subcapsular cataract; ERM epi-retinal membrane; PVD posterior vitreous detachment; RD retinal detachment; DM diabetes mellitus; DR diabetic retinopathy; NPDR non-proliferative diabetic retinopathy; PDR proliferative diabetic retinopathy; CSME clinically significant macular edema; DME diabetic macular edema; dbh dot blot hemorrhages; CWS cotton wool spot; POAG primary open angle glaucoma; C/D cup-to-disc ratio; HVF humphrey visual field; GVF goldmann visual field; OCT optical coherence tomography; IOP intraocular pressure; BRVO Branch retinal vein occlusion; CRVO central retinal vein occlusion; CRAO central retinal artery occlusion; BRAO  branch retinal artery occlusion; RT retinal tear; SB scleral buckle; PPV pars plana vitrectomy; VH Vitreous hemorrhage; PRP panretinal laser photocoagulation; IVK intravitreal kenalog; VMT vitreomacular traction; MH Macular hole;  NVD neovascularization of the  disc; NVE neovascularization elsewhere; AREDS age related eye disease study; ARMD age related macular degeneration; POAG primary open angle glaucoma; EBMD epithelial/anterior basement membrane dystrophy; ACIOL anterior chamber intraocular lens; IOL intraocular lens; PCIOL posterior chamber intraocular lens; Phaco/IOL phacoemulsification with intraocular lens placement; [place] photorefractive keratectomy; LASIK laser assisted in situ keratomileusis; HTN hypertension; DM diabetes mellitus; COPD chronic obstructive pulmonary disease

## 2019-03-26 ENCOUNTER — Ambulatory Visit (INDEPENDENT_AMBULATORY_CARE_PROVIDER_SITE_OTHER): Payer: Medicare Other | Admitting: Ophthalmology

## 2019-03-26 ENCOUNTER — Other Ambulatory Visit: Payer: Self-pay

## 2019-03-26 ENCOUNTER — Encounter (INDEPENDENT_AMBULATORY_CARE_PROVIDER_SITE_OTHER): Payer: Self-pay | Admitting: Ophthalmology

## 2019-03-26 DIAGNOSIS — H353112 Nonexudative age-related macular degeneration, right eye, intermediate dry stage: Secondary | ICD-10-CM | POA: Diagnosis not present

## 2019-03-26 DIAGNOSIS — H353221 Exudative age-related macular degeneration, left eye, with active choroidal neovascularization: Secondary | ICD-10-CM

## 2019-03-26 DIAGNOSIS — Z961 Presence of intraocular lens: Secondary | ICD-10-CM

## 2019-03-26 DIAGNOSIS — H26491 Other secondary cataract, right eye: Secondary | ICD-10-CM | POA: Diagnosis not present

## 2019-03-26 DIAGNOSIS — H3581 Retinal edema: Secondary | ICD-10-CM

## 2019-05-01 NOTE — Progress Notes (Signed)
[facility] Note  05/07/2019     CHIEF COMPLAINT Patient presents for Retina Follow Up   HISTORY OF PRESENT ILLNESS: Jacob Oliver is a 85 y.o. male who presents to the clinic today for:   HPI    Retina Follow Up    Patient presents with  Wet AMD.  In left eye.  This started 6 weeks ago.  Severity is moderate.  I, the attending physician,  performed the HPI with the patient and updated documentation appropriately.          Comments    Patient here for 6 weeks for retina follow up for Exu ARMD OS. Patient states vision doing pretty good. No eye pain.        Last edited by Bernarda Caffey, MD on 05/07/2019 11:13 AM. (History)    Patient   Referring physician: Leticia Clas, [place]. [address] [place],  [place] [address]  HISTORICAL INFORMATION:   Selected notes from the MEDICAL RECORD NUMBER Referred by Dr. Leander Rams for concern of Exudative AMD OS;  LEE- 02.21.19 ([facility]) [BCVA OD: 20/30 OS:20/20] Ocular Hx- pseudophakia OU ([facility] 10+ years ago) PMH-     CURRENT MEDICATIONS: No current outpatient medications on file. (Ophthalmic Drugs)   No current facility-administered medications for this visit.  (Ophthalmic Drugs)   Current Outpatient Medications (Other)  Medication Sig  . Calcium Carb-Ergocalciferol 500-200 MG-UNIT TABS Take by mouth.  Mariane Baumgarten Sodium 150 MG/15ML syrup Take 50 mg by mouth.  . ferrous sulfate 220 (44 Fe) MG/5ML solution Take 220 mg by mouth.  Marland Kitchen omeprazole (PRILOSEC) 20 MG capsule Take 20 mg by mouth.  . vitamin B-12 (CYANOCOBALAMIN) 500 MCG tablet Take by mouth.  . Vitamin D, Ergocalciferol, (DRISDOL) 50000 units CAPS capsule Take by mouth.   Current Facility-Administered Medications (Other)  Medication Route  . Bevacizumab (AVASTIN) SOLN 1.25 mg Intravitreal  . Bevacizumab (AVASTIN) SOLN 1.25 mg Intravitreal  . Bevacizumab (AVASTIN) SOLN 1.25 mg Intravitreal  . Bevacizumab (AVASTIN) SOLN 1.25  mg Intravitreal  . Bevacizumab (AVASTIN) SOLN 1.25 mg Intravitreal      REVIEW OF SYSTEMS: ROS    Positive for: HENT, Eyes   Negative for: Constitutional, Gastrointestinal, Neurological, Skin, Genitourinary, Musculoskeletal, Endocrine, Cardiovascular, Respiratory, Psychiatric, Allergic/Imm, Heme/Lymph   Last edited by Theodore Demark, COA on 05/07/2019 10:06 AM. (History)       ALLERGIES No Known Allergies  PAST MEDICAL HISTORY Past Medical History:  Diagnosis Date  . Macular degeneration    Wet OS, Dry OD   Past Surgical History:  Procedure Laterality Date  . CATARACT EXTRACTION Bilateral 2009/2002  . EYE SURGERY      FAMILY HISTORY Family History  Problem Relation Age of Onset  . Macular degeneration Mother   . Cataracts Mother   . Macular degeneration Sister     SOCIAL HISTORY Social History   Tobacco Use  . Smoking status: Former Research scientist (life sciences)  . Smokeless tobacco: Never Used  Substance Use Topics  . Alcohol use: Not on file  . Drug use: Not on file         OPHTHALMIC EXAM:  Base Eye Exam    Visual Acuity (Snellen - Linear)      Right Left   Dist cc 20/40 +2 20/30 -1   Dist ph cc NI NI   Correction: Glasses       Tonometry (Tonopen, 10:02 AM)      Right Left  Pressure 18 16       Pupils      Dark Light Shape React APD   Right 5 4.5 Round Slow None   Left 4 3.5 Round Slow None       Visual Fields (Counting fingers)      Left Right    Full Full       Extraocular Movement      Right Left    Full, Ortho Full, Ortho       Neuro/Psych    Oriented x3: Yes   Mood/Affect: Normal       Dilation    Both eyes: 1.0% Mydriacyl, 2.5% Phenylephrine @ 10:02 AM        Slit Lamp and Fundus Exam    Slit Lamp Exam      Right Left   Lids/Lashes Telangiectasia, Dermatochalasis - upper lid Telangiectasia   Conjunctiva/Sclera Conjunctivochalasis White and quiet   Cornea Arcus, 1+ Punctate epithelial erosions, trace Descemet's folds Arcus, 1+  inferior Punctate epithelial erosions, mild irregular epithelium inferiorly   Anterior Chamber Deep and quiet Deep and quiet   Iris Round and moderately dilated Round and dilated   Lens PC IOL in good position, open PC PC IOL in good position, clear PC   Vitreous Vitreous syneresis, Posterior vitreous detachment syneresis, Posterior vitreous detachment       Fundus Exam      Right Left   Disc Pink and Sharp Pink and Sharp   C/D Ratio 0.2 0.2   Macula Flat, good foveal reflex, mild Retinal pigment epithelial mottling and clumping, No heme or edema Blunted foveal reflex, focal MA's temporal to fovea, slight increase in cystic changes, pigment clumping nasal to fovea   Vessels Vascular attenuation Vascular attenuation   Periphery Attached Attached, mid-zonal drusen        Refraction    Wearing Rx      Sphere Cylinder Axis   Right [place] +1.25 007   Left -1.50 +2.00 177          IMAGING AND PROCEDURES  Imaging and Procedures for 09/29/17  OCT, Retina - OU - Both Eyes       Right Eye Quality was good. Central Foveal Thickness: 259. Progression has been stable. Findings include normal foveal contour, no IRF, no SRF, retinal drusen , epiretinal membrane (Mild ERM).   Left Eye Quality was good. Central Foveal Thickness: 286. Progression has worsened. Findings include abnormal foveal contour, retinal drusen , outer retinal atrophy, intraretinal fluid, intraretinal hyper-reflective material, no SRF (Interval increase in IRF/IRHM temporal macula).   Notes *Images captured and stored on drive  Diagnosis / Impression:  OD: retinal drusen, mild ERM OS: exudative ARMD; Interval increase in IRF/IRHM temporal macula  Clinical management:  See below  Abbreviations: NFP - Normal foveal profile. CME - cystoid macular edema. PED - pigment epithelial detachment. IRF - intraretinal fluid. SRF - subretinal fluid. EZ - ellipsoid zone. ERM - epiretinal membrane. ORA - outer retinal atrophy.  ORT - outer retinal tubulation. SRHM - subretinal hyper-reflective material         Intravitreal Injection, Pharmacologic Agent - OS - Left Eye       Time Out 05/07/2019. 11:33 AM. Confirmed correct patient, procedure, site, and patient consented.   Anesthesia Topical anesthesia was used. Anesthetic medications included Lidocaine 2%, Proparacaine 0.5%.   Procedure Preparation included 5% betadine to ocular surface, eyelid speculum. A 30 gauge needle was used.   Injection:  1.25 mg Bevacizumab (AVASTIN) SOLN  NDC: [id number], Lot: [id number] , Expiration date: 07/20/2019   Route: Intravitreal, Site: Left Eye, Waste: 0 mL  Post-op Post injection exam found visual acuity of at least counting fingers. The patient tolerated the procedure well. There were no complications. The patient received written and verbal post procedure care education.                 ASSESSMENT/PLAN:    ICD-10-CM   1. Exudative age-related macular degeneration of left eye with active choroidal neovascularization (HCC)  H35.3221 Intravitreal Injection, Pharmacologic Agent - OS - Left Eye    Bevacizumab (AVASTIN) SOLN 1.25 mg  2. Intermediate stage nonexudative age-related macular degeneration of right eye  H35.3112   3. Retinal edema  H35.81 OCT, Retina - OU - Both Eyes  4. Pseudophakia of both eyes  Z96.1   5. PCO (posterior capsular opacification), right  H26.491     1,3. Exudative age related macular degeneration OS.    - S/P IVA OS #1 (07.23.19), #2 (08.20.19), #3 (09.17.19), #4 (10.23.19), #5 (12.03.19)  - most recent FA (05/2018) shows mild CNV, possible RAP lesion temporal macula -- mild interval decrease in leakage  - OCT today shows interval increase in IRF/IRHM compared to last visit  - BCVA OS stable 20/30+1  - recommend IVA OS #6 today, 11.23.20 due to increase in cystic changes  - pt wishes to proceed  -  RBA of procedure discussed, questions answered  - informed consent obtained  and signed  - see procedure note  - f/u 4-6, DFE, OCT, possible injection OS  2. Age related macular degeneration, non-exudative, right eye  - The incidence, anatomy, and pathology of dry AMD, risk of progression, and the AREDS and AREDS 2 study including smoking risks discussed with patient.  - continue amsler grid monitoring  4. Pseudophakia OU-   - s/p CE/IOL OU  - beautiful surgeries, doing well  - stable, monitor  5. PCO OD-   - S/P Yag Laser OD (07.10.19)  - excellent opening in capsule  - VA stable at 20/30     Ophthalmic Meds Ordered this visit:  Meds ordered this encounter  Medications  . Bevacizumab (AVASTIN) SOLN 1.25 mg       Return for f/u 4-6 weeks, exu ARMD OS, DFE, OCT.  There are no Patient Instructions on file for this visit.   Explained the diagnoses, plan, and follow up with the patient and they expressed understanding.  Patient expressed understanding of the importance of proper follow up care.   This document serves as a record of services personally performed by 20.10.19, MD, PhD. It was created on their behalf by Gardiner Sleeper, [place], a certified ophthalmic assistant. The creation of this record is the provider's dictation and/or activities during the visit.    Electronically signed by: [address], [place] 11.17.2020 1:02 PM  11.30.2020, M.D., Ph.D. Diseases & Surgery of the Retina and Vitreous [facility]  I have reviewed the above documentation for accuracy and completeness, and I agree with the above. [facility], M.D., Ph.D. 05/07/19 1:02 PM    Abbreviations: M myopia (nearsighted); A astigmatism; H hyperopia (farsighted); P presbyopia; Mrx spectacle prescription;  CTL contact lenses; OD right eye; OS left eye; OU both eyes  XT exotropia; ET esotropia; PEK punctate epithelial keratitis; PEE punctate epithelial erosions; DES dry eye syndrome; MGD meibomian gland dysfunction; ATs artificial tears; PFAT's  preservative free artificial tears; [place] nuclear sclerotic cataract; PSC posterior  subcapsular cataract; ERM epi-retinal membrane; PVD posterior vitreous detachment; RD retinal detachment; DM diabetes mellitus; DR diabetic retinopathy; NPDR non-proliferative diabetic retinopathy; PDR proliferative diabetic retinopathy; CSME clinically significant macular edema; DME diabetic macular edema; dbh dot blot hemorrhages; CWS cotton wool spot; POAG primary open angle glaucoma; C/D cup-to-disc ratio; HVF humphrey visual field; GVF goldmann visual field; OCT optical coherence tomography; IOP intraocular pressure; BRVO Branch retinal vein occlusion; CRVO central retinal vein occlusion; CRAO central retinal artery occlusion; BRAO branch retinal artery occlusion; RT retinal tear; SB scleral buckle; PPV pars plana vitrectomy; VH Vitreous hemorrhage; PRP panretinal laser photocoagulation; IVK intravitreal kenalog; VMT vitreomacular traction; MH Macular hole;  NVD neovascularization of the disc; NVE neovascularization elsewhere; AREDS age related eye disease study; ARMD age related macular degeneration; POAG primary open angle glaucoma; EBMD epithelial/anterior basement membrane dystrophy; ACIOL anterior chamber intraocular lens; IOL intraocular lens; PCIOL posterior chamber intraocular lens; Phaco/IOL phacoemulsification with intraocular lens placement; [place] photorefractive keratectomy; LASIK laser assisted in situ keratomileusis; HTN hypertension; DM diabetes mellitus; COPD chronic obstructive pulmonary disease

## 2019-05-07 ENCOUNTER — Ambulatory Visit (INDEPENDENT_AMBULATORY_CARE_PROVIDER_SITE_OTHER): Payer: Medicare Other | Admitting: Ophthalmology

## 2019-05-07 ENCOUNTER — Encounter (INDEPENDENT_AMBULATORY_CARE_PROVIDER_SITE_OTHER): Payer: Self-pay | Admitting: Ophthalmology

## 2019-05-07 DIAGNOSIS — H353112 Nonexudative age-related macular degeneration, right eye, intermediate dry stage: Secondary | ICD-10-CM | POA: Diagnosis not present

## 2019-05-07 DIAGNOSIS — H353221 Exudative age-related macular degeneration, left eye, with active choroidal neovascularization: Secondary | ICD-10-CM | POA: Diagnosis not present

## 2019-05-07 DIAGNOSIS — Z961 Presence of intraocular lens: Secondary | ICD-10-CM

## 2019-05-07 DIAGNOSIS — H3581 Retinal edema: Secondary | ICD-10-CM

## 2019-05-07 DIAGNOSIS — H26491 Other secondary cataract, right eye: Secondary | ICD-10-CM

## 2019-05-07 MED ORDER — BEVACIZUMAB CHEMO INJECTION 1.25MG/0.05ML SYRINGE FOR KALEIDOSCOPE
1.2500 mg | INTRAVITREAL | Status: AC | PRN
  Administered 2019-05-07: 13:00:00 1.25 mg via INTRAVITREAL

## 2019-05-16 DIAGNOSIS — E7800 Pure hypercholesterolemia, unspecified: Secondary | ICD-10-CM | POA: Diagnosis not present

## 2019-05-16 DIAGNOSIS — I10 Essential (primary) hypertension: Secondary | ICD-10-CM | POA: Diagnosis not present

## 2019-05-16 DIAGNOSIS — M159 Polyosteoarthritis, unspecified: Secondary | ICD-10-CM | POA: Diagnosis not present

## 2019-06-18 ENCOUNTER — Encounter (INDEPENDENT_AMBULATORY_CARE_PROVIDER_SITE_OTHER): Payer: Medicare Other | Admitting: Ophthalmology

## 2019-06-18 NOTE — Progress Notes (Signed)
[facility] Note  06/21/2019     CHIEF COMPLAINT Patient presents for Retina Follow Up   HISTORY OF PRESENT ILLNESS: Jacob Oliver is a 86 y.o. male who presents to the clinic today for:   HPI    Retina Follow Up    Patient presents with  Wet AMD.  In left eye.  Severity is moderate.  Duration of 6 weeks.  Since onset it is stable.  I, the attending physician,  performed the HPI with the patient and updated documentation appropriately.          Comments    Patient states vision the same OU.        Last edited by Bernarda Caffey, MD on 06/21/2019  1:47 PM. (History)    Patient states his left eye is doing "pretty good"  Referring physician: No referring provider defined for this encounter.  HISTORICAL INFORMATION:   Selected notes from the MEDICAL RECORD NUMBER Referred by Dr. Leander Rams for concern of Exudative AMD OS;  LEE- 02.21.19 ([facility]) [BCVA OD: 20/30 OS:20/20] Ocular Hx- pseudophakia OU ([facility] 10+ years ago) PMH-     CURRENT MEDICATIONS: No current outpatient medications on file. (Ophthalmic Drugs)   No current facility-administered medications for this visit. (Ophthalmic Drugs)   Current Outpatient Medications (Other)  Medication Sig  . Calcium Carb-Ergocalciferol 500-200 MG-UNIT TABS Take by mouth.  Mariane Baumgarten Sodium 150 MG/15ML syrup Take 50 mg by mouth.  . ferrous sulfate 220 (44 Fe) MG/5ML solution Take 220 mg by mouth.  Marland Kitchen omeprazole (PRILOSEC) 20 MG capsule Take 20 mg by mouth.  . vitamin B-12 (CYANOCOBALAMIN) 500 MCG tablet Take by mouth.  . Vitamin D, Ergocalciferol, (DRISDOL) 50000 units CAPS capsule Take by mouth.   Current Facility-Administered Medications (Other)  Medication Route  . Bevacizumab (AVASTIN) SOLN 1.25 mg Intravitreal  . Bevacizumab (AVASTIN) SOLN 1.25 mg Intravitreal  . Bevacizumab (AVASTIN) SOLN 1.25 mg Intravitreal  . Bevacizumab (AVASTIN) SOLN 1.25 mg Intravitreal  . Bevacizumab  (AVASTIN) SOLN 1.25 mg Intravitreal      REVIEW OF SYSTEMS: ROS    Positive for: HENT, Eyes   Negative for: Constitutional, Gastrointestinal, Neurological, Skin, Genitourinary, Musculoskeletal, Endocrine, Cardiovascular, Respiratory, Psychiatric, Allergic/Imm, Heme/Lymph   Last edited by Roselee Nova D, COT on 06/21/2019  1:13 PM. (History)       ALLERGIES No Known Allergies  PAST MEDICAL HISTORY Past Medical History:  Diagnosis Date  . Macular degeneration    Wet OS, Dry OD   Past Surgical History:  Procedure Laterality Date  . CATARACT EXTRACTION Bilateral 2009/2002  . EYE SURGERY      FAMILY HISTORY Family History  Problem Relation Age of Onset  . Macular degeneration Mother   . Cataracts Mother   . Macular degeneration Sister     SOCIAL HISTORY Social History   Tobacco Use  . Smoking status: Former Research scientist (life sciences)  . Smokeless tobacco: Never Used  Substance Use Topics  . Alcohol use: Not on file  . Drug use: Not on file         OPHTHALMIC EXAM:  Base Eye Exam    Visual Acuity (Snellen - Linear)      Right Left   Dist cc 20/40 +2 20/30 -2   Dist ph cc NI 20/30   Correction: Glasses       Tonometry (Tonopen, 1:19 PM)      Right Left   Pressure 16 19  Pupils      Dark Light Shape React APD   Right 5 4.5 Round Minimal None   Left 4 3.5 Round Minimal None       Visual Fields (Counting fingers)      Left Right    Full Full       Extraocular Movement      Right Left    Full, Ortho Full, Ortho       Neuro/Psych    Oriented x3: Yes   Mood/Affect: Normal       Dilation    Both eyes: 1.0% Mydriacyl, 2.5% Phenylephrine @ 1:19 PM        Slit Lamp and Fundus Exam    Slit Lamp Exam      Right Left   Lids/Lashes Telangiectasia, Dermatochalasis - upper lid Telangiectasia   Conjunctiva/Sclera Conjunctivochalasis White and quiet   Cornea Arcus, 1+ Punctate epithelial erosions, trace Descemet's folds Arcus, 1+ inferior Punctate epithelial  erosions, mild irregular epithelium inferiorly   Anterior Chamber Deep and quiet Deep and quiet   Iris Round and moderately dilated Round and dilated   Lens PC IOL in good position, open PC PC IOL in good position, clear PC   Vitreous Vitreous syneresis, Posterior vitreous detachment syneresis, Posterior vitreous detachment       Fundus Exam      Right Left   Disc Pink and Sharp Pink and Sharp   C/D Ratio 0.2 0.2   Macula Flat, good foveal reflex, mild Retinal pigment epithelial mottling and clumping, No heme or edema Blunted foveal reflex, focal MA's temporal to fovea, mild improvement in cystic changes, pigment clumping nasal to fovea   Vessels Vascular attenuation Vascular attenuation   Periphery Attached Attached, mid-zonal drusen        Refraction    Wearing Rx      Sphere Cylinder Axis   Right [place] +1.25 007   Left -1.50 +2.00 177          IMAGING AND PROCEDURES  Imaging and Procedures for 09/29/17  OCT, Retina - OU - Both Eyes       Right Eye Quality was good. Central Foveal Thickness: 264. Progression has been stable. Findings include normal foveal contour, no IRF, no SRF, retinal drusen , epiretinal membrane (Mild ERM).   Left Eye Quality was good. Central Foveal Thickness: 270. Progression has improved. Findings include abnormal foveal contour, retinal drusen , outer retinal atrophy, intraretinal fluid, intraretinal hyper-reflective material, no SRF (Interval improvement in IRF/IRHM temporal macula).   Notes *Images captured and stored on drive  Diagnosis / Impression:  OD: retinal drusen, mild ERM OS: exudative ARMD; Interval improvement in IRF/IRHM temporal macula  Clinical management:  See below  Abbreviations: NFP - Normal foveal profile. CME - cystoid macular edema. PED - pigment epithelial detachment. IRF - intraretinal fluid. SRF - subretinal fluid. EZ - ellipsoid zone. ERM - epiretinal membrane. ORA - outer retinal atrophy. ORT - outer retinal  tubulation. SRHM - subretinal hyper-reflective material         Intravitreal Injection, Pharmacologic Agent - OS - Left Eye       Time Out 06/21/2019. 1:50 PM. Confirmed correct patient, procedure, site, and patient consented.   Anesthesia Topical anesthesia was used. Anesthetic medications included Lidocaine 2%, Proparacaine 0.5%.   Procedure Preparation included 5% betadine to ocular surface, eyelid speculum. A 30 gauge needle was used.   Injection:  1.25 mg Bevacizumab (AVASTIN) SOLN   NDC: [id number], Lot: [id number] , Expiration date: 08/14/2019  Route: Intravitreal, Site: Left Eye, Waste: 0 mL  Post-op Post injection exam found visual acuity of at least counting fingers. The patient tolerated the procedure well. There were no complications. The patient received written and verbal post procedure care education.                 ASSESSMENT/PLAN:    ICD-10-CM   1. Exudative age-related macular degeneration of left eye with active choroidal neovascularization (HCC)  H35.3221 Intravitreal Injection, Pharmacologic Agent - OS - Left Eye    Bevacizumab (AVASTIN) SOLN 1.25 mg  2. Intermediate stage nonexudative age-related macular degeneration of right eye  H35.3112   3. Retinal edema  H35.81 OCT, Retina - OU - Both Eyes  4. Pseudophakia of both eyes  Z96.1   5. PCO (posterior capsular opacification), right  H26.491     1,3. Exudative age related macular degeneration OS.    - S/P IVA OS #1 (07.23.19), #2 (08.20.19), #3 (09.17.19), #4 (10.23.19), #5 (12.03.19), #6 (11.23.20)  - most recent FA (05/2018) shows mild CNV, possible RAP lesion temporal macula -- mild interval decrease in leakage  - OCT today shows interval decrease in IRF/IRHM compared to last visit  - BCVA OS stable 20/30+1  - recommend IVA OS #7 today, 01.07.21   - pt wishes to proceed  -  RBA of procedure discussed, questions answered  - informed consent obtained and signed  - see procedure note  -  f/u 6 wks, DFE, OCT, possible injection OS  2. Age related macular degeneration, non-exudative, right eye  - The incidence, anatomy, and pathology of dry AMD, risk of progression, and the AREDS and AREDS 2 study including smoking risks discussed with patient.  - continue amsler grid monitoring  4. Pseudophakia OU-   - s/p CE/IOL OU  - beautiful surgeries, doing well  - stable, monitor  5. PCO OD-   - S/P Yag Laser OD (07.10.19)  - excellent opening in capsule  - VA stable at 20/30     Ophthalmic Meds Ordered this visit:  Meds ordered this encounter  Medications  . Bevacizumab (AVASTIN) SOLN 1.25 mg       Return in about 6 weeks (around 08/02/2019) for f/u exu ARMD OS, DFE, OCT.  There are no Patient Instructions on file for this visit.   Explained the diagnoses, plan, and follow up with the patient and they expressed understanding.  Patient expressed understanding of the importance of proper follow up care.   This document serves as a record of services personally performed by Gardiner Sleeper, MD, PhD. It was created on their behalf by Leeann Must, [place], a certified ophthalmic assistant. The creation of this record is the provider's dictation and/or activities during the visit.    Electronically signed by: Leeann Must, COA @TODAY @ 1:29 PM   This document serves as a record of services personally performed by Gardiner Sleeper, MD, PhD. It was created on their behalf by Ernest Mallick, OA, an ophthalmic assistant. The creation of this record is the provider's dictation and/or activities during the visit.    Electronically signed by: Ernest Mallick, OA 01.07.2021 1:29 PM   Gardiner Sleeper, M.D., Ph.D. Diseases & Surgery of the Retina and Vitreous [facility]  I have reviewed the above documentation for accuracy and completeness, and I agree with the above. Gardiner Sleeper, M.D., Ph.D. 06/22/19 1:30 PM   Abbreviations: M myopia (nearsighted); A  astigmatism; H hyperopia (farsighted); P presbyopia; Mrx  spectacle prescription;  CTL contact lenses; OD right eye; OS left eye; OU both eyes  XT exotropia; ET esotropia; PEK punctate epithelial keratitis; PEE punctate epithelial erosions; DES dry eye syndrome; MGD meibomian gland dysfunction; ATs artificial tears; PFAT's preservative free artificial tears; [place] nuclear sclerotic cataract; PSC posterior subcapsular cataract; ERM epi-retinal membrane; PVD posterior vitreous detachment; RD retinal detachment; DM diabetes mellitus; DR diabetic retinopathy; NPDR non-proliferative diabetic retinopathy; PDR proliferative diabetic retinopathy; CSME clinically significant macular edema; DME diabetic macular edema; dbh dot blot hemorrhages; CWS cotton wool spot; POAG primary open angle glaucoma; C/D cup-to-disc ratio; HVF humphrey visual field; GVF goldmann visual field; OCT optical coherence tomography; IOP intraocular pressure; BRVO Branch retinal vein occlusion; CRVO central retinal vein occlusion; CRAO central retinal artery occlusion; BRAO branch retinal artery occlusion; RT retinal tear; SB scleral buckle; PPV pars plana vitrectomy; VH Vitreous hemorrhage; PRP panretinal laser photocoagulation; IVK intravitreal kenalog; VMT vitreomacular traction; MH Macular hole;  NVD neovascularization of the disc; NVE neovascularization elsewhere; AREDS age related eye disease study; ARMD age related macular degeneration; POAG primary open angle glaucoma; EBMD epithelial/anterior basement membrane dystrophy; ACIOL anterior chamber intraocular lens; IOL intraocular lens; PCIOL posterior chamber intraocular lens; Phaco/IOL phacoemulsification with intraocular lens placement; [place] photorefractive keratectomy; LASIK laser assisted in situ keratomileusis; HTN hypertension; DM diabetes mellitus; COPD chronic obstructive pulmonary disease

## 2019-06-21 ENCOUNTER — Ambulatory Visit (INDEPENDENT_AMBULATORY_CARE_PROVIDER_SITE_OTHER): Payer: Medicare Other | Admitting: Ophthalmology

## 2019-06-21 ENCOUNTER — Encounter (INDEPENDENT_AMBULATORY_CARE_PROVIDER_SITE_OTHER): Payer: Self-pay | Admitting: Ophthalmology

## 2019-06-21 DIAGNOSIS — H26491 Other secondary cataract, right eye: Secondary | ICD-10-CM | POA: Diagnosis not present

## 2019-06-21 DIAGNOSIS — Z961 Presence of intraocular lens: Secondary | ICD-10-CM | POA: Diagnosis not present

## 2019-06-21 DIAGNOSIS — H353221 Exudative age-related macular degeneration, left eye, with active choroidal neovascularization: Secondary | ICD-10-CM | POA: Diagnosis not present

## 2019-06-21 DIAGNOSIS — H3581 Retinal edema: Secondary | ICD-10-CM

## 2019-06-21 DIAGNOSIS — H353112 Nonexudative age-related macular degeneration, right eye, intermediate dry stage: Secondary | ICD-10-CM | POA: Diagnosis not present

## 2019-06-22 MED ORDER — BEVACIZUMAB CHEMO INJECTION 1.25MG/0.05ML SYRINGE FOR KALEIDOSCOPE
1.2500 mg | INTRAVITREAL | Status: AC | PRN
  Administered 2019-06-22: 1.25 mg via INTRAVITREAL

## 2019-07-30 DIAGNOSIS — M159 Polyosteoarthritis, unspecified: Secondary | ICD-10-CM | POA: Diagnosis not present

## 2019-07-30 DIAGNOSIS — I10 Essential (primary) hypertension: Secondary | ICD-10-CM | POA: Diagnosis not present

## 2019-07-30 DIAGNOSIS — E7800 Pure hypercholesterolemia, unspecified: Secondary | ICD-10-CM | POA: Diagnosis not present

## 2019-08-03 ENCOUNTER — Encounter (INDEPENDENT_AMBULATORY_CARE_PROVIDER_SITE_OTHER): Payer: Medicare Other | Admitting: Ophthalmology

## 2019-08-03 NOTE — Progress Notes (Signed)
[facility] Note  08/06/2019     CHIEF COMPLAINT Patient presents for Retina Follow Up   HISTORY OF PRESENT ILLNESS: Jacob Oliver is a 86 y.o. male who presents to the clinic today for:   HPI    Retina Follow Up    Patient presents with  Wet AMD.  In left eye.  This started 6 weeks ago.  Severity is moderate.  I, the attending physician,  performed the HPI with the patient and updated documentation appropriately.          Comments    Patient here for 6 week retina follow up for exu ARMD OS. Patient states vision doing pretty good. No eye pain.       Last edited by Bernarda Caffey, MD on 08/06/2019 12:53 PM. (History)    Patient states his left eye vision is good, he states he had a large floater after the last injection  Referring physician: No referring provider defined for this encounter.  HISTORICAL INFORMATION:   Selected notes from the MEDICAL RECORD NUMBER Referred by Dr. Leander Rams for concern of Exudative AMD OS;  LEE- 02.21.19 ([facility]) [BCVA OD: 20/30 OS:20/20] Ocular Hx- pseudophakia OU ([facility] 10+ years ago) PMH-     CURRENT MEDICATIONS: No current outpatient medications on file. (Ophthalmic Drugs)   No current facility-administered medications for this visit. (Ophthalmic Drugs)   Current Outpatient Medications (Other)  Medication Sig  . Calcium Carb-Ergocalciferol 500-200 MG-UNIT TABS Take by mouth.  Mariane Baumgarten Sodium 150 MG/15ML syrup Take 50 mg by mouth.  . ferrous sulfate 220 (44 Fe) MG/5ML solution Take 220 mg by mouth.  Marland Kitchen omeprazole (PRILOSEC) 20 MG capsule Take 20 mg by mouth.  . vitamin B-12 (CYANOCOBALAMIN) 500 MCG tablet Take by mouth.  . Vitamin D, Ergocalciferol, (DRISDOL) 50000 units CAPS capsule Take by mouth.   Current Facility-Administered Medications (Other)  Medication Route  . Bevacizumab (AVASTIN) SOLN 1.25 mg Intravitreal  . Bevacizumab (AVASTIN) SOLN 1.25 mg Intravitreal  . Bevacizumab  (AVASTIN) SOLN 1.25 mg Intravitreal  . Bevacizumab (AVASTIN) SOLN 1.25 mg Intravitreal  . Bevacizumab (AVASTIN) SOLN 1.25 mg Intravitreal      REVIEW OF SYSTEMS: ROS    Positive for: HENT, Eyes   Negative for: Constitutional, Gastrointestinal, Neurological, Skin, Genitourinary, Musculoskeletal, Endocrine, Cardiovascular, Respiratory, Psychiatric, Allergic/Imm, Heme/Lymph   Last edited by Theodore Demark, COA on 08/06/2019 10:07 AM. (History)       ALLERGIES No Known Allergies  PAST MEDICAL HISTORY Past Medical History:  Diagnosis Date  . Macular degeneration    Wet OS, Dry OD   Past Surgical History:  Procedure Laterality Date  . CATARACT EXTRACTION Bilateral 2009/2002  . EYE SURGERY      FAMILY HISTORY Family History  Problem Relation Age of Onset  . Macular degeneration Mother   . Cataracts Mother   . Macular degeneration Sister     SOCIAL HISTORY Social History   Tobacco Use  . Smoking status: Former Research scientist (life sciences)  . Smokeless tobacco: Never Used  Substance Use Topics  . Alcohol use: Not on file  . Drug use: Not on file         OPHTHALMIC EXAM:  Base Eye Exam    Visual Acuity (Snellen - Linear)      Right Left   Dist cc 20/40 +2 20/30 -2   Dist ph cc 20/30 -2 20/20 -2   Correction: Glasses       Tonometry (Tonopen, 10:04  AM)      Right Left   Pressure 13 15       Pupils      Dark Light Shape React APD   Right 5 4.5 Round Minimal None   Left 4 3.5 Round Minimal None       Visual Fields (Counting fingers)      Left Right    Full Full       Extraocular Movement      Right Left    Full, Ortho Full, Ortho       Neuro/Psych    Oriented x3: Yes   Mood/Affect: Normal       Dilation    Both eyes: 1.0% Mydriacyl, 2.5% Phenylephrine @ 10:03 AM        Slit Lamp and Fundus Exam    Slit Lamp Exam      Right Left   Lids/Lashes Telangiectasia, Dermatochalasis - upper lid Telangiectasia   Conjunctiva/Sclera Conjunctivochalasis White and  quiet   Cornea Arcus, 1+ Punctate epithelial erosions, trace Descemet's folds Arcus, 1+ inferior Punctate epithelial erosions, mild irregular epithelium inferiorly   Anterior Chamber Deep and quiet Deep and quiet   Iris Round and moderately dilated Round and dilated   Lens PC IOL in good position, open PC PC IOL in good position, clear PC   Vitreous Vitreous syneresis, Posterior vitreous detachment syneresis, Posterior vitreous detachment       Fundus Exam      Right Left   Disc Mild Pallor, Sharp rim Pink and Sharp   C/D Ratio 0.2 0.2   Macula Flat, good foveal reflex, mild Retinal pigment epithelial mottling and clumping, No heme or edema Blunted foveal reflex, focal MA's temporal to fovea, persistent cystic changes, pigment clumping nasal to fovea, no heme   Vessels Vascular attenuation Vascular attenuation, mild Tortuousity   Periphery Attached, mild reticular degeneration, No heme  Attached, mid-zonal drusen, mild reticular degeneration, No heme         Refraction    Wearing Rx      Sphere Cylinder Axis   Right [place] +1.25 007   Left -1.50 +2.00 177          IMAGING AND PROCEDURES  Imaging and Procedures for 09/29/17  OCT, Retina - OU - Both Eyes       Right Eye Quality was good. Central Foveal Thickness: 265. Progression has been stable. Findings include normal foveal contour, no IRF, no SRF, retinal drusen , epiretinal membrane (Mild ERM).   Left Eye Quality was good. Central Foveal Thickness: 266. Progression has been stable. Findings include abnormal foveal contour, retinal drusen , outer retinal atrophy, intraretinal fluid, intraretinal hyper-reflective material, no SRF (persistent IRF/IRHM temporal macula).   Notes *Images captured and stored on drive  Diagnosis / Impression:  OD: retinal drusen, mild ERM OS: exudative ARMD; persistent IRF/IRHM temporal macula  Clinical management:  See below  Abbreviations: NFP - Normal foveal profile. CME - cystoid  macular edema. PED - pigment epithelial detachment. IRF - intraretinal fluid. SRF - subretinal fluid. EZ - ellipsoid zone. ERM - epiretinal membrane. ORA - outer retinal atrophy. ORT - outer retinal tubulation. SRHM - subretinal hyper-reflective material         Intravitreal Injection, Pharmacologic Agent - OS - Left Eye       Time Out 08/06/2019. 10:06 AM. Confirmed correct patient, procedure, site, and patient consented.   Anesthesia Topical anesthesia was used. Anesthetic medications included Lidocaine 2%, Proparacaine 0.5%.   Procedure Preparation included  5% betadine to ocular surface, eyelid speculum. A supplied needle was used.   Injection:  1.25 mg Bevacizumab (AVASTIN) SOLN   NDC: [id number], Lot: [id number] , Expiration date: 08/29/2019   Route: Intravitreal, Site: Left Eye, Waste: 0 mL  Post-op Post injection exam found visual acuity of at least counting fingers. The patient tolerated the procedure well. There were no complications. The patient received written and verbal post procedure care education.                 ASSESSMENT/PLAN:    ICD-10-CM   1. Exudative age-related macular degeneration of left eye with active choroidal neovascularization (HCC)  H35.3221 Intravitreal Injection, Pharmacologic Agent - OS - Left Eye    Bevacizumab (AVASTIN) SOLN 1.25 mg  2. Intermediate stage nonexudative age-related macular degeneration of right eye  H35.3112   3. Retinal edema  H35.81 OCT, Retina - OU - Both Eyes  4. Pseudophakia of both eyes  Z96.1   5. PCO (posterior capsular opacification), right  H26.491     1,3. Exudative age related macular degeneration OS.    - S/P IVA OS #1 (07.23.19), #2 (08.20.19), #3 (09.17.19), #4 (10.23.19), #5 (12.03.19), #6 (11.23.20), #7 (01.07.21)  - most recent FA (05/2018) shows mild CNV, possible RAP lesion temporal macula -- mild interval decrease in leakage  - OCT today shows persistent IRF/IRHM  - BCVA OS improved to  20/20-2  - recommend IVA OS #8 today, 02.22.21 w/ extension to 8 wks  - pt wishes to proceed  - RBA of procedure discussed, questions answered  - informed consent obtained   - Avastin informed consent form signed and scanned on 01.07.2021  - see procedure note  - f/u 8 wks, DFE, OCT, possible injection OS  2. Age related macular degeneration, non-exudative, right eye  - The incidence, anatomy, and pathology of dry AMD, risk of progression, and the AREDS and AREDS 2 study including smoking risks discussed with patient.  - continue amsler grid monitoring  4. Pseudophakia OU-   - s/p CE/IOL OU  - beautiful surgeries, doing well  - stable, monitor  5. PCO OD-   - S/P Yag Laser OD (07.10.19)  - excellent opening in capsule  - VA stable at 20/30     Ophthalmic Meds Ordered this visit:  Meds ordered this encounter  Medications  . Bevacizumab (AVASTIN) SOLN 1.25 mg       Return in about 8 weeks (around 10/01/2019) for f/u exu ARMD OS, DFE, OCT.  There are no Patient Instructions on file for this visit.   Explained the diagnoses, plan, and follow up with the patient and they expressed understanding.  Patient expressed understanding of the importance of proper follow up care.   This document serves as a record of services personally performed by 10/14/2019, MD, PhD. It was created on their behalf by Gardiner Sleeper, [place], a certified ophthalmic assistant. The creation of this record is the provider's dictation and/or activities during the visit.    Electronically signed by: [address], COA @TODAY @ 12:54 PM   This document serves as a record of services personally performed by Leeann Must, MD, PhD. It was created on their behalf by Gardiner Sleeper, OA, an ophthalmic assistant. The creation of this record is the provider's dictation and/or activities during the visit.    Electronically signed by: Ernest Mallick, OA 02.22.2021 12:54 PM  03.07.2021, M.D., Ph.D. Diseases  & Surgery of the Retina and Vitreous [facility]  [place]  I have reviewed the above documentation for accuracy and completeness, and I agree with the above. Gardiner Sleeper, M.D., Ph.D. 08/06/19 12:55 PM    Abbreviations: M myopia (nearsighted); A astigmatism; H hyperopia (farsighted); P presbyopia; Mrx spectacle prescription;  CTL contact lenses; OD right eye; OS left eye; OU both eyes  XT exotropia; ET esotropia; PEK punctate epithelial keratitis; PEE punctate epithelial erosions; DES dry eye syndrome; MGD meibomian gland dysfunction; ATs artificial tears; PFAT's preservative free artificial tears; [place] nuclear sclerotic cataract; PSC posterior subcapsular cataract; ERM epi-retinal membrane; PVD posterior vitreous detachment; RD retinal detachment; DM diabetes mellitus; DR diabetic retinopathy; NPDR non-proliferative diabetic retinopathy; PDR proliferative diabetic retinopathy; CSME clinically significant macular edema; DME diabetic macular edema; dbh dot blot hemorrhages; CWS cotton wool spot; POAG primary open angle glaucoma; C/D cup-to-disc ratio; HVF humphrey visual field; GVF goldmann visual field; OCT optical coherence tomography; IOP intraocular pressure; BRVO Branch retinal vein occlusion; CRVO central retinal vein occlusion; CRAO central retinal artery occlusion; BRAO branch retinal artery occlusion; RT retinal tear; SB scleral buckle; PPV pars plana vitrectomy; VH Vitreous hemorrhage; PRP panretinal laser photocoagulation; IVK intravitreal kenalog; VMT vitreomacular traction; MH Macular hole;  NVD neovascularization of the disc; NVE neovascularization elsewhere; AREDS age related eye disease study; ARMD age related macular degeneration; POAG primary open angle glaucoma; EBMD epithelial/anterior basement membrane dystrophy; ACIOL anterior chamber intraocular lens; IOL intraocular lens; PCIOL posterior chamber intraocular lens; Phaco/IOL phacoemulsification with intraocular lens  placement; [place] photorefractive keratectomy; LASIK laser assisted in situ keratomileusis; HTN hypertension; DM diabetes mellitus; COPD chronic obstructive pulmonary disease

## 2019-08-06 ENCOUNTER — Encounter (INDEPENDENT_AMBULATORY_CARE_PROVIDER_SITE_OTHER): Payer: Self-pay | Admitting: Ophthalmology

## 2019-08-06 ENCOUNTER — Ambulatory Visit (INDEPENDENT_AMBULATORY_CARE_PROVIDER_SITE_OTHER): Payer: Medicare Other | Admitting: Ophthalmology

## 2019-08-06 DIAGNOSIS — Z961 Presence of intraocular lens: Secondary | ICD-10-CM | POA: Diagnosis not present

## 2019-08-06 DIAGNOSIS — H3581 Retinal edema: Secondary | ICD-10-CM

## 2019-08-06 DIAGNOSIS — H26491 Other secondary cataract, right eye: Secondary | ICD-10-CM | POA: Diagnosis not present

## 2019-08-06 DIAGNOSIS — H353221 Exudative age-related macular degeneration, left eye, with active choroidal neovascularization: Secondary | ICD-10-CM

## 2019-08-06 DIAGNOSIS — H353112 Nonexudative age-related macular degeneration, right eye, intermediate dry stage: Secondary | ICD-10-CM

## 2019-08-06 MED ORDER — BEVACIZUMAB CHEMO INJECTION 1.25MG/0.05ML SYRINGE FOR KALEIDOSCOPE
1.2500 mg | INTRAVITREAL | Status: AC | PRN
  Administered 2019-08-06: 13:00:00 1.25 mg via INTRAVITREAL

## 2019-08-15 DIAGNOSIS — Z1339 Encounter for screening examination for other mental health and behavioral disorders: Secondary | ICD-10-CM | POA: Diagnosis not present

## 2019-08-15 DIAGNOSIS — Z7189 Other specified counseling: Secondary | ICD-10-CM | POA: Diagnosis not present

## 2019-08-15 DIAGNOSIS — Z6822 Body mass index (BMI) 22.0-22.9, adult: Secondary | ICD-10-CM | POA: Diagnosis not present

## 2019-08-15 DIAGNOSIS — E785 Hyperlipidemia, unspecified: Secondary | ICD-10-CM | POA: Diagnosis not present

## 2019-08-15 DIAGNOSIS — Z1211 Encounter for screening for malignant neoplasm of colon: Secondary | ICD-10-CM | POA: Diagnosis not present

## 2019-08-15 DIAGNOSIS — R5383 Other fatigue: Secondary | ICD-10-CM | POA: Diagnosis not present

## 2019-08-15 DIAGNOSIS — Z1331 Encounter for screening for depression: Secondary | ICD-10-CM | POA: Diagnosis not present

## 2019-08-15 DIAGNOSIS — Z299 Encounter for prophylactic measures, unspecified: Secondary | ICD-10-CM | POA: Diagnosis not present

## 2019-08-15 DIAGNOSIS — I10 Essential (primary) hypertension: Secondary | ICD-10-CM | POA: Diagnosis not present

## 2019-08-15 DIAGNOSIS — N1832 Chronic kidney disease, stage 3b: Secondary | ICD-10-CM | POA: Diagnosis not present

## 2019-08-15 DIAGNOSIS — Z0000 Encounter for general adult medical examination without abnormal findings: Secondary | ICD-10-CM | POA: Diagnosis not present

## 2019-08-16 DIAGNOSIS — E785 Hyperlipidemia, unspecified: Secondary | ICD-10-CM | POA: Diagnosis not present

## 2019-08-16 DIAGNOSIS — Z79899 Other long term (current) drug therapy: Secondary | ICD-10-CM | POA: Diagnosis not present

## 2019-08-16 DIAGNOSIS — R5383 Other fatigue: Secondary | ICD-10-CM | POA: Diagnosis not present

## 2019-08-16 DIAGNOSIS — Z125 Encounter for screening for malignant neoplasm of prostate: Secondary | ICD-10-CM | POA: Diagnosis not present

## 2019-08-23 DIAGNOSIS — E7800 Pure hypercholesterolemia, unspecified: Secondary | ICD-10-CM | POA: Diagnosis not present

## 2019-08-23 DIAGNOSIS — M159 Polyosteoarthritis, unspecified: Secondary | ICD-10-CM | POA: Diagnosis not present

## 2019-08-23 DIAGNOSIS — I10 Essential (primary) hypertension: Secondary | ICD-10-CM | POA: Diagnosis not present

## 2019-09-20 DIAGNOSIS — M159 Polyosteoarthritis, unspecified: Secondary | ICD-10-CM | POA: Diagnosis not present

## 2019-09-20 DIAGNOSIS — I10 Essential (primary) hypertension: Secondary | ICD-10-CM | POA: Diagnosis not present

## 2019-09-20 DIAGNOSIS — E7800 Pure hypercholesterolemia, unspecified: Secondary | ICD-10-CM | POA: Diagnosis not present

## 2019-09-26 NOTE — Progress Notes (Signed)
[facility] Note  10/01/2019     CHIEF COMPLAINT Patient presents for Retina Follow Up   HISTORY OF PRESENT ILLNESS: Jacob Oliver is a 86 y.o. male who presents to the clinic today for:   HPI    Retina Follow Up    Patient presents with  Wet AMD.  In left eye.  This started weeks ago.  Severity is moderate.  Duration of weeks.  Since onset it is stable.          Comments    Pt states his vision is stable OU.  Pt denies eye pain or discomfort and denies any new or worsening floaters or fol OU.       Last edited by Doneen Poisson on 10/01/2019  1:51 PM. (History)    Patient states he is doing well, no visual complaints  Referring physician: No referring provider defined for this encounter.  HISTORICAL INFORMATION:   Selected notes from the MEDICAL RECORD NUMBER Referred by Dr. Leander Rams for concern of Exudative AMD OS;  LEE- 02.21.19 ([facility]) [BCVA OD: 20/30 OS:20/20] Ocular Hx- pseudophakia OU ([facility] 10+ years ago) PMH-     CURRENT MEDICATIONS: No current outpatient medications on file. (Ophthalmic Drugs)   No current facility-administered medications for this visit. (Ophthalmic Drugs)   Current Outpatient Medications (Other)  Medication Sig  . Calcium Carb-Ergocalciferol 500-200 MG-UNIT TABS Take by mouth.  Mariane Baumgarten Sodium 150 MG/15ML syrup Take 50 mg by mouth.  . ferrous sulfate 220 (44 Fe) MG/5ML solution Take 220 mg by mouth.  Marland Kitchen omeprazole (PRILOSEC) 20 MG capsule Take 20 mg by mouth.  . vitamin B-12 (CYANOCOBALAMIN) 500 MCG tablet Take by mouth.  . Vitamin D, Ergocalciferol, (DRISDOL) 50000 units CAPS capsule Take by mouth.   Current Facility-Administered Medications (Other)  Medication Route  . Bevacizumab (AVASTIN) SOLN 1.25 mg Intravitreal  . Bevacizumab (AVASTIN) SOLN 1.25 mg Intravitreal  . Bevacizumab (AVASTIN) SOLN 1.25 mg Intravitreal  . Bevacizumab (AVASTIN) SOLN 1.25 mg Intravitreal  . Bevacizumab  (AVASTIN) SOLN 1.25 mg Intravitreal      REVIEW OF SYSTEMS: ROS    Positive for: HENT, Eyes   Negative for: Constitutional, Gastrointestinal, Neurological, Skin, Genitourinary, Musculoskeletal, Endocrine, Cardiovascular, Respiratory, Psychiatric, Allergic/Imm, Heme/Lymph   Last edited by Doneen Poisson on 10/01/2019  1:43 PM. (History)       ALLERGIES No Known Allergies  PAST MEDICAL HISTORY Past Medical History:  Diagnosis Date  . Macular degeneration    Wet OS, Dry OD   Past Surgical History:  Procedure Laterality Date  . CATARACT EXTRACTION Bilateral 2009/2002  . EYE SURGERY      FAMILY HISTORY Family History  Problem Relation Age of Onset  . Macular degeneration Mother   . Cataracts Mother   . Macular degeneration Sister     SOCIAL HISTORY Social History   Tobacco Use  . Smoking status: Former Research scientist (life sciences)  . Smokeless tobacco: Never Used  Substance Use Topics  . Alcohol use: Not on file  . Drug use: Not on file         OPHTHALMIC EXAM:  Base Eye Exam    Visual Acuity (Snellen - Linear)      Right Left   Dist cc 20/40 20/30   Dist ph cc NI 20/25   Correction: Glasses       Tonometry (Tonopen, 1:49 PM)      Right Left   Pressure 20 20  Pupils      Dark Light Shape React APD   Right 5 4 Round Minimal 0   Left 5 5 Irregular Minimal 0       Visual Fields      Left Right    Full Full       Extraocular Movement      Right Left    Full Full       Neuro/Psych    Oriented x3: Yes   Mood/Affect: Normal       Dilation    Both eyes: 1.0% Mydriacyl, 2.5% Phenylephrine @ 1:49 PM        Slit Lamp and Fundus Exam    Slit Lamp Exam      Right Left   Lids/Lashes Telangiectasia, Dermatochalasis - upper lid Telangiectasia   Conjunctiva/Sclera Conjunctivochalasis White and quiet   Cornea Arcus, 1+ Punctate epithelial erosions, trace Descemet's folds Arcus, 1+ inferior Punctate epithelial erosions, mild irregular epithelium inferiorly    Anterior Chamber Deep and quiet Deep and quiet   Iris Round and moderately dilated Round and dilated   Lens PC IOL in good position, open PC PC IOL in good position, clear PC   Vitreous Vitreous syneresis, Posterior vitreous detachment syneresis, Posterior vitreous detachment       Fundus Exam      Right Left   Disc Mild Pallor, Sharp rim Mild Pallor, Sharp rim   C/D Ratio 0.2 0.2   Macula Flat, good foveal reflex, mild Retinal pigment epithelial mottling and clumping, No heme or edema Blunted foveal reflex, focal MA's temporal to fovea, persistent cystic changes, pigment clumping nasal to fovea, no heme   Vessels Vascular attenuation Vascular attenuation, mild Tortuousity   Periphery Attached, mild reticular degeneration, No heme  Attached, mid-zonal drusen, mild reticular degeneration, No heme         Refraction    Wearing Rx      Sphere Cylinder Axis   Right [place] +1.25 007   Left -1.50 +2.00 177          IMAGING AND PROCEDURES  Imaging and Procedures for 09/29/17  OCT, Retina - OU - Both Eyes       Right Eye Quality was good. Central Foveal Thickness: 262. Progression has been stable. Findings include normal foveal contour, no IRF, no SRF, retinal drusen , epiretinal membrane (Mild ERM).   Left Eye Quality was good. Central Foveal Thickness: 268. Progression has been stable. Findings include abnormal foveal contour, retinal drusen , outer retinal atrophy, intraretinal fluid, intraretinal hyper-reflective material, no SRF (persistent IRF temporal fovea).   Notes *Images captured and stored on drive  Diagnosis / Impression:  OD: retinal drusen, mild ERM OS: exudative ARMD; persistent IRF temporal macula  Clinical management:  See below  Abbreviations: NFP - Normal foveal profile. CME - cystoid macular edema. PED - pigment epithelial detachment. IRF - intraretinal fluid. SRF - subretinal fluid. EZ - ellipsoid zone. ERM - epiretinal membrane. ORA - outer retinal  atrophy. ORT - outer retinal tubulation. SRHM - subretinal hyper-reflective material         Intravitreal Injection, Pharmacologic Agent - OS - Left Eye       Time Out 10/01/2019. 1:35 PM. Confirmed correct patient, procedure, site, and patient consented.   Anesthesia Topical anesthesia was used. Anesthetic medications included Lidocaine 2%, Proparacaine 0.5%.   Procedure Preparation included 5% betadine to ocular surface, eyelid speculum. A supplied needle was used.   Injection:  1.25 mg Bevacizumab (AVASTIN) SOLN  NDC: [id number], Lot: [id number] , Expiration date: 11/15/2019   Route: Intravitreal, Site: Left Eye, Waste: 0 mL  Post-op Post injection exam found visual acuity of at least counting fingers. The patient tolerated the procedure well. There were no complications. The patient received written and verbal post procedure care education.                 ASSESSMENT/PLAN:    ICD-10-CM   1. Exudative age-related macular degeneration of left eye with active choroidal neovascularization (HCC)  H35.3221 Intravitreal Injection, Pharmacologic Agent - OS - Left Eye    Bevacizumab (AVASTIN) SOLN 1.25 mg  2. Intermediate stage nonexudative age-related macular degeneration of right eye  H35.3112   3. Retinal edema  H35.81 OCT, Retina - OU - Both Eyes  4. Pseudophakia of both eyes  Z96.1     1,3. Exudative age related macular degeneration OS.    - S/P IVA OS #1 (07.23.19), #2 (08.20.19), #3 (09.17.19), #4 (10.23.19), #5 (12.03.19), #6 (11.23.20), #7 (01.07.21), #8 (02.22.21)  - most recent FA (05/2018) shows mild CNV, possible RAP lesion temporal macula -- mild interval decrease in leakage  - OCT today shows persistent IRF/IRHM  - BCVA OS decreased to 20/25 from 20/20-2  - recommend IVA OS #9 today, 04.19.21 w/ f/u in 7 wks  - pt wishes to proceed  - RBA of procedure discussed, questions answered  - informed consent obtained   - Avastin informed consent form signed and  scanned on 01.07.2021  - see procedure note  - f/u 7 wks, DFE, OCT, possible injection OS  2. Age related macular degeneration, non-exudative, right eye  - The incidence, anatomy, and pathology of dry AMD, risk of progression, and the AREDS and AREDS 2 study including smoking risks discussed with patient.  - continue amsler grid monitoring  4. Pseudophakia OU-   - s/p CE/IOL OU  - beautiful surgeries, doing well  - stable, monitor  Ophthalmic Meds Ordered this visit:  Meds ordered this encounter  Medications  . Bevacizumab (AVASTIN) SOLN 1.25 mg       Return in about 7 weeks (around 11/19/2019) for f/u exu ARMD OS, DFE, OCT.  There are no Patient Instructions on file for this visit.   Explained the diagnoses, plan, and follow up with the patient and they expressed understanding.  Patient expressed understanding of the importance of proper follow up care.   This document serves as a record of services personally performed by 19/12/2019, MD, PhD. It was created on their behalf by Gardiner Sleeper, [place], a certified ophthalmic assistant. The creation of this record is the provider's dictation and/or activities during the visit.    Electronically signed by: [address], [place] @TODAY @ 4:05 PM   This document serves as a record of services personally performed by Leeann Must, MD, PhD. It was created on their behalf by Gardiner Sleeper, OA, an ophthalmic assistant. The creation of this record is the provider's dictation and/or activities during the visit.    Electronically signed by: Ernest Mallick, OA 04.19.2021 4:05 PM   05.02.2021, M.D., Ph.D. Diseases & Surgery of the Retina and Vitreous [facility]  I have reviewed the above documentation for accuracy and completeness, and I agree with the above. [facility], M.D., Ph.D. 10/01/19 4:05 PM   Abbreviations: M myopia (nearsighted); A astigmatism; H hyperopia (farsighted); P presbyopia; Mrx  spectacle prescription;  CTL contact lenses; OD right eye; OS left eye; OU both  eyes  XT exotropia; ET esotropia; PEK punctate epithelial keratitis; PEE punctate epithelial erosions; DES dry eye syndrome; MGD meibomian gland dysfunction; ATs artificial tears; PFAT's preservative free artificial tears; [place] nuclear sclerotic cataract; PSC posterior subcapsular cataract; ERM epi-retinal membrane; PVD posterior vitreous detachment; RD retinal detachment; DM diabetes mellitus; DR diabetic retinopathy; NPDR non-proliferative diabetic retinopathy; PDR proliferative diabetic retinopathy; CSME clinically significant macular edema; DME diabetic macular edema; dbh dot blot hemorrhages; CWS cotton wool spot; POAG primary open angle glaucoma; C/D cup-to-disc ratio; HVF humphrey visual field; GVF goldmann visual field; OCT optical coherence tomography; IOP intraocular pressure; BRVO Branch retinal vein occlusion; CRVO central retinal vein occlusion; CRAO central retinal artery occlusion; BRAO branch retinal artery occlusion; RT retinal tear; SB scleral buckle; PPV pars plana vitrectomy; VH Vitreous hemorrhage; PRP panretinal laser photocoagulation; IVK intravitreal kenalog; VMT vitreomacular traction; MH Macular hole;  NVD neovascularization of the disc; NVE neovascularization elsewhere; AREDS age related eye disease study; ARMD age related macular degeneration; POAG primary open angle glaucoma; EBMD epithelial/anterior basement membrane dystrophy; ACIOL anterior chamber intraocular lens; IOL intraocular lens; PCIOL posterior chamber intraocular lens; Phaco/IOL phacoemulsification with intraocular lens placement; [place] photorefractive keratectomy; LASIK laser assisted in situ keratomileusis; HTN hypertension; DM diabetes mellitus; COPD chronic obstructive pulmonary disease

## 2019-10-01 ENCOUNTER — Encounter (INDEPENDENT_AMBULATORY_CARE_PROVIDER_SITE_OTHER): Payer: Self-pay | Admitting: Ophthalmology

## 2019-10-01 ENCOUNTER — Ambulatory Visit (INDEPENDENT_AMBULATORY_CARE_PROVIDER_SITE_OTHER): Payer: Medicare Other | Admitting: Ophthalmology

## 2019-10-01 ENCOUNTER — Other Ambulatory Visit: Payer: Self-pay

## 2019-10-01 DIAGNOSIS — H353221 Exudative age-related macular degeneration, left eye, with active choroidal neovascularization: Secondary | ICD-10-CM | POA: Diagnosis not present

## 2019-10-01 DIAGNOSIS — Z961 Presence of intraocular lens: Secondary | ICD-10-CM | POA: Diagnosis not present

## 2019-10-01 DIAGNOSIS — H353112 Nonexudative age-related macular degeneration, right eye, intermediate dry stage: Secondary | ICD-10-CM

## 2019-10-01 DIAGNOSIS — H3581 Retinal edema: Secondary | ICD-10-CM

## 2019-10-01 MED ORDER — BEVACIZUMAB CHEMO INJECTION 1.25MG/0.05ML SYRINGE FOR KALEIDOSCOPE
1.2500 mg | INTRAVITREAL | Status: AC | PRN
  Administered 2019-10-01: 1.25 mg via INTRAVITREAL

## 2019-11-11 DIAGNOSIS — E7800 Pure hypercholesterolemia, unspecified: Secondary | ICD-10-CM | POA: Diagnosis not present

## 2019-11-11 DIAGNOSIS — I10 Essential (primary) hypertension: Secondary | ICD-10-CM | POA: Diagnosis not present

## 2019-11-11 DIAGNOSIS — M159 Polyosteoarthritis, unspecified: Secondary | ICD-10-CM | POA: Diagnosis not present

## 2019-11-16 NOTE — Progress Notes (Signed)
[facility] Note  11/19/2019     CHIEF COMPLAINT Patient presents for Retina Follow Up   HISTORY OF PRESENT ILLNESS: Jacob Oliver is a 86 y.o. male who presents to the clinic today for:   HPI    Retina Follow Up    Patient presents with  Wet AMD.  In left eye.  Severity is moderate.  Duration of 7 weeks.  Since onset it is stable.  I, the attending physician,  performed the HPI with the patient and updated documentation appropriately.          Comments    Patient states vision the same OU.       Last edited by Bernarda Caffey, MD on 11/19/2019  2:21 PM. (History)    Patient states his vision is pretty good, he states after the last injection his eye hurt for about 2 weeks, he feels like he is "taking a bad cold" which started yesterday  Referring physician: No referring provider defined for this encounter.  HISTORICAL INFORMATION:   Selected notes from the MEDICAL RECORD NUMBER Referred by Dr. Leander Rams for concern of Exudative AMD OS;  LEE- 02.21.19 ([facility]) [BCVA OD: 20/30 OS:20/20] Ocular Hx- pseudophakia OU ([facility] 10+ years ago) PMH-     CURRENT MEDICATIONS: No current outpatient medications on file. (Ophthalmic Drugs)   No current facility-administered medications for this visit. (Ophthalmic Drugs)   Current Outpatient Medications (Other)  Medication Sig  . Calcium Carb-Ergocalciferol 500-200 MG-UNIT TABS Take by mouth.  Mariane Baumgarten Sodium 150 MG/15ML syrup Take 50 mg by mouth.  . ferrous sulfate 220 (44 Fe) MG/5ML solution Take 220 mg by mouth.  Marland Kitchen omeprazole (PRILOSEC) 20 MG capsule Take 20 mg by mouth.  . vitamin B-12 (CYANOCOBALAMIN) 500 MCG tablet Take by mouth.  . Vitamin D, Ergocalciferol, (DRISDOL) 50000 units CAPS capsule Take by mouth.   Current Facility-Administered Medications (Other)  Medication Route  . Bevacizumab (AVASTIN) SOLN 1.25 mg Intravitreal  . Bevacizumab (AVASTIN) SOLN 1.25 mg Intravitreal  .  Bevacizumab (AVASTIN) SOLN 1.25 mg Intravitreal  . Bevacizumab (AVASTIN) SOLN 1.25 mg Intravitreal  . Bevacizumab (AVASTIN) SOLN 1.25 mg Intravitreal      REVIEW OF SYSTEMS: ROS    Positive for: HENT, Eyes   Negative for: Constitutional, Gastrointestinal, Neurological, Skin, Genitourinary, Musculoskeletal, Endocrine, Cardiovascular, Respiratory, Psychiatric, Allergic/Imm, Heme/Lymph   Last edited by Roselee Nova D, COT on 11/19/2019  1:47 PM. (History)       ALLERGIES No Known Allergies  PAST MEDICAL HISTORY Past Medical History:  Diagnosis Date  . Macular degeneration    Wet OS, Dry OD   Past Surgical History:  Procedure Laterality Date  . CATARACT EXTRACTION Bilateral 2009/2002  . EYE SURGERY      FAMILY HISTORY Family History  Problem Relation Age of Onset  . Macular degeneration Mother   . Cataracts Mother   . Macular degeneration Sister     SOCIAL HISTORY Social History   Tobacco Use  . Smoking status: Former Research scientist (life sciences)  . Smokeless tobacco: Never Used  Substance Use Topics  . Alcohol use: Not on file  . Drug use: Not on file         OPHTHALMIC EXAM:  Base Eye Exam    Visual Acuity (Snellen - Linear)      Right Left   Dist cc 20/30 20/30   Dist ph cc 20/30 +2 20/25   Correction: Glasses       Tonometry (  Tonopen, 1:53 PM)      Right Left   Pressure 19 14       Pupils      Dark Light Shape React APD   Right 5 4 Round Minimal None   Left 5 5 Irregular Minimal None       Visual Fields (Counting fingers)      Left Right    Full Full       Extraocular Movement      Right Left    Full, Ortho Full, Ortho       Neuro/Psych    Oriented x3: Yes   Mood/Affect: Normal       Dilation    Both eyes: 1.0% Mydriacyl, 2.5% Phenylephrine @ 1:53 PM        Slit Lamp and Fundus Exam    Slit Lamp Exam      Right Left   Lids/Lashes Telangiectasia, Dermatochalasis - upper lid Telangiectasia   Conjunctiva/Sclera Conjunctivochalasis White and  quiet   Cornea Arcus, 1+ Punctate epithelial erosions, trace Descemet's folds Arcus, 1+ inferior Punctate epithelial erosions, mild irregular epithelium inferiorly   Anterior Chamber Deep and quiet Deep and quiet   Iris Round and moderately dilated Round and dilated   Lens PC IOL in good position, open PC PC IOL in good position, clear PC   Vitreous Vitreous syneresis, Posterior vitreous detachment syneresis, Posterior vitreous detachment       Fundus Exam      Right Left   Disc Mild Pallor, Sharp rim Mild Pallor, Sharp rim   C/D Ratio 0.2 0.2   Macula Flat, good foveal reflex, mild Retinal pigment epithelial mottling and clumping, No heme or edema Blunted foveal reflex, focal MA's temporal to fovea, persistent cystic changes - slightly improved, pigment clumping nasal to fovea, mild ERM, no heme   Vessels Vascular attenuation Vascular attenuation, mild Tortuousity   Periphery Attached, mild reticular degeneration, No heme  Attached, mid-zonal drusen, mild reticular degeneration, No heme         Refraction    Wearing Rx      Sphere Cylinder Axis   Right [place] +1.25 007   Left -1.50 +2.00 177          IMAGING AND PROCEDURES  Imaging and Procedures for 09/29/17  OCT, Retina - OU - Both Eyes       Right Eye Quality was good. Central Foveal Thickness: 265. Progression has been stable. Findings include normal foveal contour, no IRF, no SRF, retinal drusen , epiretinal membrane (Mild ERM).   Left Eye Quality was good. Central Foveal Thickness: 268. Progression has been stable. Findings include abnormal foveal contour, retinal drusen , outer retinal atrophy, intraretinal fluid, intraretinal hyper-reflective material, no SRF (Interval improvement in cystic changes).   Notes *Images captured and stored on drive  Diagnosis / Impression:  OD: retinal drusen, mild ERM OS: exudative ARMD; Interval improvement in cystic changes  Clinical management:  See below  Abbreviations: NFP  - Normal foveal profile. CME - cystoid macular edema. PED - pigment epithelial detachment. IRF - intraretinal fluid. SRF - subretinal fluid. EZ - ellipsoid zone. ERM - epiretinal membrane. ORA - outer retinal atrophy. ORT - outer retinal tubulation. SRHM - subretinal hyper-reflective material         Intravitreal Injection, Pharmacologic Agent - OS - Left Eye       Time Out 11/19/2019. 2:23 PM. Confirmed correct patient, procedure, site, and patient consented.   Anesthesia Topical anesthesia was used. Anesthetic medications  included Lidocaine 2%, Proparacaine 0.5%.   Procedure Preparation included 5% betadine to ocular surface, eyelid speculum. A supplied needle was used.   Injection:  1.25 mg Bevacizumab (AVASTIN) SOLN   NDC: 78588-502-77, Lot: [id number] , Expiration date: 01/16/2020   Route: Intravitreal, Site: Left Eye, Waste: 0 mL  Post-op Post injection exam found visual acuity of at least counting fingers. The patient tolerated the procedure well. There were no complications. The patient received written and verbal post procedure care education.                 ASSESSMENT/PLAN:    ICD-10-CM   1. Exudative age-related macular degeneration of left eye with active choroidal neovascularization (HCC)  H35.3221 Intravitreal Injection, Pharmacologic Agent - OS - Left Eye    Bevacizumab (AVASTIN) SOLN 1.25 mg  2. Intermediate stage nonexudative age-related macular degeneration of right eye  H35.3112   3. Retinal edema  H35.81 OCT, Retina - OU - Both Eyes  4. Pseudophakia of both eyes  Z96.1     1,3. Exudative age related macular degeneration OS.    - S/P IVA OS #1 (07.23.19), #2 (08.20.19), #3 (09.17.19), #4 (10.23.19), #5 (12.03.19), #6 (11.23.20), #7 (01.07.21), #8 (02.22.21), #9 (04.19.21)  - most recent FA (05/2018) shows mild CNV, possible RAP lesion temporal macula -- mild interval decrease in leakage  - OCT today shows persistent IRF/IRHM -- improved  - BCVA OS  stable at 20/25   - recommend IVA OS #10 today, 06.07.21 w/ f/u in 8 wks  - pt wishes to proceed  - RBA of procedure discussed, questions answered  - informed consent obtained   - Avastin informed consent form signed and scanned on 01.07.2021  - see procedure note  - f/u 8 wks, DFE, OCT, possible injection OS; tx and ext as able  2. Age related macular degeneration, non-exudative, right eye  - The incidence, anatomy, and pathology of dry AMD, risk of progression, and the AREDS and AREDS 2 study including smoking risks discussed with patient.  - continue amsler grid monitoring  4. Pseudophakia OU-   - s/p CE/IOL OU  - beautiful surgeries, doing well  - stable, monitor  Ophthalmic Meds Ordered this visit:  Meds ordered this encounter  Medications  . Bevacizumab (AVASTIN) SOLN 1.25 mg       Return in about 8 weeks (around 01/14/2020) for f/u exu ARMD OS, DFE, OCT.  There are no Patient Instructions on file for this visit.   Explained the diagnoses, plan, and follow up with the patient and they expressed understanding.  Patient expressed understanding of the importance of proper follow up care.   This document serves as a record of services personally performed by 21/07/2019, MD, PhD. It was created on their behalf by Gardiner Sleeper, OA, an ophthalmic assistant. The creation of this record is the provider's dictation and/or activities during the visit.    Electronically signed by: Ernest Mallick, OA 06.04.2021 2:45 PM  19.04.2021, M.D., Ph.D. Diseases & Surgery of the Retina and Vitreous [facility]  I have reviewed the above documentation for accuracy and completeness, and I agree with the above. 3[facility], M.D., Ph.D. 11/19/19 2:45 PM   Abbreviations: M myopia (nearsighted); A astigmatism; H hyperopia (farsighted); P presbyopia; Mrx spectacle prescription;  CTL contact lenses; OD right eye; OS left eye; OU both eyes  XT exotropia; ET  esotropia; PEK punctate epithelial keratitis; PEE punctate epithelial erosions; DES dry eye syndrome;  MGD meibomian gland dysfunction; ATs artificial tears; PFAT's preservative free artificial tears; [place] nuclear sclerotic cataract; PSC posterior subcapsular cataract; ERM epi-retinal membrane; PVD posterior vitreous detachment; RD retinal detachment; DM diabetes mellitus; DR diabetic retinopathy; NPDR non-proliferative diabetic retinopathy; PDR proliferative diabetic retinopathy; CSME clinically significant macular edema; DME diabetic macular edema; dbh dot blot hemorrhages; CWS cotton wool spot; POAG primary open angle glaucoma; C/D cup-to-disc ratio; HVF humphrey visual field; GVF goldmann visual field; OCT optical coherence tomography; IOP intraocular pressure; BRVO Branch retinal vein occlusion; CRVO central retinal vein occlusion; CRAO central retinal artery occlusion; BRAO branch retinal artery occlusion; RT retinal tear; SB scleral buckle; PPV pars plana vitrectomy; VH Vitreous hemorrhage; PRP panretinal laser photocoagulation; IVK intravitreal kenalog; VMT vitreomacular traction; MH Macular hole;  NVD neovascularization of the disc; NVE neovascularization elsewhere; AREDS age related eye disease study; ARMD age related macular degeneration; POAG primary open angle glaucoma; EBMD epithelial/anterior basement membrane dystrophy; ACIOL anterior chamber intraocular lens; IOL intraocular lens; PCIOL posterior chamber intraocular lens; Phaco/IOL phacoemulsification with intraocular lens placement; [place] photorefractive keratectomy; LASIK laser assisted in situ keratomileusis; HTN hypertension; DM diabetes mellitus; COPD chronic obstructive pulmonary disease

## 2019-11-19 ENCOUNTER — Encounter (INDEPENDENT_AMBULATORY_CARE_PROVIDER_SITE_OTHER): Payer: Self-pay | Admitting: Ophthalmology

## 2019-11-19 ENCOUNTER — Other Ambulatory Visit: Payer: Self-pay

## 2019-11-19 ENCOUNTER — Ambulatory Visit (INDEPENDENT_AMBULATORY_CARE_PROVIDER_SITE_OTHER): Payer: Medicare Other | Admitting: Ophthalmology

## 2019-11-19 DIAGNOSIS — H3581 Retinal edema: Secondary | ICD-10-CM | POA: Diagnosis not present

## 2019-11-19 DIAGNOSIS — Z961 Presence of intraocular lens: Secondary | ICD-10-CM | POA: Diagnosis not present

## 2019-11-19 DIAGNOSIS — H353221 Exudative age-related macular degeneration, left eye, with active choroidal neovascularization: Secondary | ICD-10-CM | POA: Diagnosis not present

## 2019-11-19 DIAGNOSIS — H353112 Nonexudative age-related macular degeneration, right eye, intermediate dry stage: Secondary | ICD-10-CM

## 2019-11-19 MED ORDER — BEVACIZUMAB CHEMO INJECTION 1.25MG/0.05ML SYRINGE FOR KALEIDOSCOPE
1.2500 mg | INTRAVITREAL | Status: AC | PRN
  Administered 2019-11-19: 1.25 mg via INTRAVITREAL

## 2019-12-12 DIAGNOSIS — E7800 Pure hypercholesterolemia, unspecified: Secondary | ICD-10-CM | POA: Diagnosis not present

## 2019-12-12 DIAGNOSIS — M159 Polyosteoarthritis, unspecified: Secondary | ICD-10-CM | POA: Diagnosis not present

## 2019-12-12 DIAGNOSIS — I10 Essential (primary) hypertension: Secondary | ICD-10-CM | POA: Diagnosis not present

## 2020-01-11 DIAGNOSIS — I10 Essential (primary) hypertension: Secondary | ICD-10-CM | POA: Diagnosis not present

## 2020-01-11 DIAGNOSIS — M159 Polyosteoarthritis, unspecified: Secondary | ICD-10-CM | POA: Diagnosis not present

## 2020-01-11 DIAGNOSIS — E7800 Pure hypercholesterolemia, unspecified: Secondary | ICD-10-CM | POA: Diagnosis not present

## 2020-01-16 NOTE — Progress Notes (Signed)
[facility] Note  01/21/2020     CHIEF COMPLAINT Patient presents for Retina Follow Up   HISTORY OF PRESENT ILLNESS: Jacob Oliver is a 86 y.o. male who presents to the clinic today for:   HPI    Retina Follow Up    Patient presents with  Wet AMD.  In left eye.  Duration of 9 weeks.  Since onset it is stable.  I, the attending physician,  performed the HPI with the patient and updated documentation appropriately.          Comments    9 week follow up NVAMD OS-  Vision appears stable OU.  He lost his glasses and has already ordered a replacement.  Denies any new problems.  Doesn't use eye drops.        Last edited by Bernarda Caffey, MD on 01/21/2020  2:25 PM. (History)    Patient states his vision seems to be pretty good, but he lost his glasses, he says it could be 2-3 weeks before he gets new ones  Referring physician: No referring provider defined for this encounter.  HISTORICAL INFORMATION:   Selected notes from the MEDICAL RECORD NUMBER Referred by Dr. Leander Rams for concern of Exudative AMD OS;  LEE- 02.21.19 ([facility]) [BCVA OD: 20/30 OS:20/20] Ocular Hx- pseudophakia OU ([facility] 10+ years ago) PMH-     CURRENT MEDICATIONS: No current outpatient medications on file. (Ophthalmic Drugs)   No current facility-administered medications for this visit. (Ophthalmic Drugs)   Current Outpatient Medications (Other)  Medication Sig  . Calcium Carb-Ergocalciferol 500-200 MG-UNIT TABS Take by mouth.  Mariane Baumgarten Sodium 150 MG/15ML syrup Take 50 mg by mouth.  . ferrous sulfate 220 (44 Fe) MG/5ML solution Take 220 mg by mouth.  Marland Kitchen omeprazole (PRILOSEC) 20 MG capsule Take 20 mg by mouth.  . vitamin B-12 (CYANOCOBALAMIN) 500 MCG tablet Take by mouth.  . Vitamin D, Ergocalciferol, (DRISDOL) 50000 units CAPS capsule Take by mouth.   Current Facility-Administered Medications (Other)  Medication Route  . Bevacizumab (AVASTIN) SOLN 1.25 mg  Intravitreal  . Bevacizumab (AVASTIN) SOLN 1.25 mg Intravitreal  . Bevacizumab (AVASTIN) SOLN 1.25 mg Intravitreal  . Bevacizumab (AVASTIN) SOLN 1.25 mg Intravitreal  . Bevacizumab (AVASTIN) SOLN 1.25 mg Intravitreal      REVIEW OF SYSTEMS: ROS    Positive for: Gastrointestinal, HENT, Eyes   Negative for: Constitutional, Neurological, Skin, Genitourinary, Musculoskeletal, Endocrine, Cardiovascular, Respiratory, Psychiatric, Allergic/Imm, Heme/Lymph   Last edited by Leonie Douglas, COA on 01/21/2020  1:39 PM. (History)       ALLERGIES No Known Allergies  PAST MEDICAL HISTORY Past Medical History:  Diagnosis Date  . Macular degeneration    Wet OS, Dry OD   Past Surgical History:  Procedure Laterality Date  . CATARACT EXTRACTION Bilateral 2009/2002  . EYE SURGERY      FAMILY HISTORY Family History  Problem Relation Age of Onset  . Macular degeneration Mother   . Cataracts Mother   . Macular degeneration Sister     SOCIAL HISTORY Social History   Tobacco Use  . Smoking status: Former Research scientist (life sciences)  . Smokeless tobacco: Never Used  Vaping Use  . Vaping Use: Never used  Substance Use Topics  . Alcohol use: Not on file  . Drug use: Not on file         OPHTHALMIC EXAM:  Base Eye Exam    Visual Acuity (Snellen - Linear)      Right Left  Dist cc 20/30 20/30   Dist ph cc NI NI   Correction: Glasses  I put last glasses Rx on file in phoropter to check vision.       Tonometry (Tonopen, 1:49 PM)      Right Left   Pressure 13 14       Pupils      Dark Light Shape React APD   Right 5 4 Round Brisk None   Left 5 5 Irregular Minimal None       Visual Fields (Counting fingers)      Left Right    Full Full       Extraocular Movement      Right Left    Full Full       Neuro/Psych    Oriented x3: Yes   Mood/Affect: Normal       Dilation    Both eyes: 1.0% Mydriacyl, 2.5% Phenylephrine @ 1:49 PM        Slit Lamp and Fundus Exam    Slit Lamp Exam       Right Left   Lids/Lashes Telangiectasia, Dermatochalasis - upper lid Telangiectasia   Conjunctiva/Sclera Conjunctivochalasis White and quiet   Cornea Arcus, 1+ Punctate epithelial erosions, trace Descemet's folds Arcus, 1+ inferior Punctate epithelial erosions, mild irregular epithelium inferiorly   Anterior Chamber Deep and quiet Deep and quiet   Iris Round and moderately dilated Round and dilated   Lens PC IOL in good position, open PC PC IOL in good position, clear PC   Vitreous Vitreous syneresis, Posterior vitreous detachment syneresis, Posterior vitreous detachment       Fundus Exam      Right Left   Disc Mild Pallor, Sharp rim, Compact Mild Pallor, Sharp rim, Compact   C/D Ratio 0.2 0.2   Macula Flat, good foveal reflex, mild Retinal pigment epithelial mottling and clumping, No heme or edema Blunted foveal reflex, focal MA's temporal to fovea, persistent cystic changes, pigment clumping nasal to fovea, mild ERM, no heme   Vessels Vascular attenuation Vascular attenuation, mild Tortuousity   Periphery Attached, mild reticular degeneration, No heme  Attached, mid-zonal drusen, mild reticular degeneration, No heme           IMAGING AND PROCEDURES  Imaging and Procedures for 09/29/17  OCT, Retina - OU - Both Eyes       Right Eye Quality was good. Central Foveal Thickness: 257. Progression has been stable. Findings include normal foveal contour, no IRF, no SRF, retinal drusen , epiretinal membrane (Mild ERM).   Left Eye Quality was good. Central Foveal Thickness: 260. Progression has been stable. Findings include abnormal foveal contour, retinal drusen , outer retinal atrophy, intraretinal fluid, intraretinal hyper-reflective material, no SRF (Persistent IRF/IRHM).   Notes *Images captured and stored on drive  Diagnosis / Impression:  OD: retinal drusen, mild ERM OS: exudative ARMD; Persistent IRF/IRHM  Clinical management:  See below  Abbreviations: NFP - Normal  foveal profile. CME - cystoid macular edema. PED - pigment epithelial detachment. IRF - intraretinal fluid. SRF - subretinal fluid. EZ - ellipsoid zone. ERM - epiretinal membrane. ORA - outer retinal atrophy. ORT - outer retinal tubulation. SRHM - subretinal hyper-reflective material         Intravitreal Injection, Pharmacologic Agent - OS - Left Eye       Time Out 01/21/2020. 2:21 PM. Confirmed correct patient, procedure, site, and patient consented.   Anesthesia Topical anesthesia was used. Anesthetic medications included Lidocaine 2%, Proparacaine 0.5%.  Procedure Preparation included 5% betadine to ocular surface, eyelid speculum. A supplied needle was used.   Injection:  1.25 mg Bevacizumab (AVASTIN) SOLN   NDC: 10626-948-54, Lot: [id number] , Expiration date: 02/21/2020   Route: Intravitreal, Site: Left Eye, Waste: 0 mL  Post-op Post injection exam found visual acuity of at least counting fingers. The patient tolerated the procedure well. There were no complications. The patient received written and verbal post procedure care education. Post injection medications were not given.                 ASSESSMENT/PLAN:    ICD-10-CM   1. Exudative age-related macular degeneration of left eye with active choroidal neovascularization (HCC)  H35.3221 Intravitreal Injection, Pharmacologic Agent - OS - Left Eye    Bevacizumab (AVASTIN) SOLN 1.25 mg  2. Retinal edema  H35.81 OCT, Retina - OU - Both Eyes  3. Intermediate stage nonexudative age-related macular degeneration of right eye  H35.3112   4. Pseudophakia of both eyes  Z96.1     1,2. Exudative age related macular degeneration OS.    - S/P IVA OS #1 (07.23.19), #2 (08.20.19), #3 (09.17.19), #4 (10.23.19), #5 (12.03.19), #6 (11.23.20), #7 (01.07.21), #8 (02.22.21), #9 (04.19.21), #10 (06.07.21)  - most recent FA (05/2018) shows mild CNV, possible RAP lesion temporal macula -- mild interval decrease in leakage  - OCT today shows  persistent IRF/IRHM  - BCVA OS decreased to 20/30 from 20/25, but pt lost his glasses  - recommend IVA OS #11 today, 08.09.21 w/ f/u in 8 wks  - pt wishes to proceed  - RBA of procedure discussed, questions answered  - informed consent obtained   - Avastin informed consent form signed and scanned on 01.07.2021  - see procedure note  - f/u 8 wks, DFE, OCT, possible injection OS; tx and ext as able  3. Age related macular degeneration, non-exudative, right eye  - The incidence, anatomy, and pathology of dry AMD, risk of progression, and the AREDS and AREDS 2 study including smoking risks discussed with patient.  - continue amsler grid monitoring  4. Pseudophakia OU-   - s/p CE/IOL OU  - beautiful surgeries, doing well  - stable, monitor   Ophthalmic Meds Ordered this visit:  Meds ordered this encounter  Medications  . Bevacizumab (AVASTIN) SOLN 1.25 mg       Return in about 8 weeks (around 03/17/2020) for f/u exy ARMD OS, DFE, OCT.  There are no Patient Instructions on file for this visit.   Explained the diagnoses, plan, and follow up with the patient and they expressed understanding.  Patient expressed understanding of the importance of proper follow up care.   This document serves as a record of services personally performed by 23/09/2019, MD, PhD. It was created on their behalf by Gardiner Sleeper, an ophthalmic technician. The creation of this record is the provider's dictation and/or activities during the visit.    Electronically signed by: Leonie Douglas COA, 01/21/20  3:44 PM   This document serves as a record of services personally performed by 21/09/21, MD, PhD. It was created on their behalf by Gardiner Sleeper. San Jetty, OA an ophthalmic technician. The creation of this record is the provider's dictation and/or activities during the visit.    Electronically signed by: Owens Shark. San Jetty, Owens Shark 08.09.2021 3:44 PM   21.09.2021, M.D., Ph.D. Diseases & Surgery of the Retina  and Vitreous [facility]  I have reviewed the  above documentation for accuracy and completeness, and I agree with the above. Gardiner Sleeper, M.D., Ph.D. 01/21/20 3:44 PM   Abbreviations: M myopia (nearsighted); A astigmatism; H hyperopia (farsighted); P presbyopia; Mrx spectacle prescription;  CTL contact lenses; OD right eye; OS left eye; OU both eyes  XT exotropia; ET esotropia; PEK punctate epithelial keratitis; PEE punctate epithelial erosions; DES dry eye syndrome; MGD meibomian gland dysfunction; ATs artificial tears; PFAT's preservative free artificial tears; [place] nuclear sclerotic cataract; PSC posterior subcapsular cataract; ERM epi-retinal membrane; PVD posterior vitreous detachment; RD retinal detachment; DM diabetes mellitus; DR diabetic retinopathy; NPDR non-proliferative diabetic retinopathy; PDR proliferative diabetic retinopathy; CSME clinically significant macular edema; DME diabetic macular edema; dbh dot blot hemorrhages; CWS cotton wool spot; POAG primary open angle glaucoma; C/D cup-to-disc ratio; HVF humphrey visual field; GVF goldmann visual field; OCT optical coherence tomography; IOP intraocular pressure; BRVO Branch retinal vein occlusion; CRVO central retinal vein occlusion; CRAO central retinal artery occlusion; BRAO branch retinal artery occlusion; RT retinal tear; SB scleral buckle; PPV pars plana vitrectomy; VH Vitreous hemorrhage; PRP panretinal laser photocoagulation; IVK intravitreal kenalog; VMT vitreomacular traction; MH Macular hole;  NVD neovascularization of the disc; NVE neovascularization elsewhere; AREDS age related eye disease study; ARMD age related macular degeneration; POAG primary open angle glaucoma; EBMD epithelial/anterior basement membrane dystrophy; ACIOL anterior chamber intraocular lens; IOL intraocular lens; PCIOL posterior chamber intraocular lens; Phaco/IOL phacoemulsification with intraocular lens placement; [place] photorefractive  keratectomy; LASIK laser assisted in situ keratomileusis; HTN hypertension; DM diabetes mellitus; COPD chronic obstructive pulmonary disease

## 2020-01-21 ENCOUNTER — Other Ambulatory Visit: Payer: Self-pay

## 2020-01-21 ENCOUNTER — Ambulatory Visit (INDEPENDENT_AMBULATORY_CARE_PROVIDER_SITE_OTHER): Payer: Medicare Other | Admitting: Ophthalmology

## 2020-01-21 ENCOUNTER — Encounter (INDEPENDENT_AMBULATORY_CARE_PROVIDER_SITE_OTHER): Payer: Self-pay | Admitting: Ophthalmology

## 2020-01-21 ENCOUNTER — Encounter (INDEPENDENT_AMBULATORY_CARE_PROVIDER_SITE_OTHER): Payer: Medicare Other | Admitting: Ophthalmology

## 2020-01-21 DIAGNOSIS — Z961 Presence of intraocular lens: Secondary | ICD-10-CM | POA: Diagnosis not present

## 2020-01-21 DIAGNOSIS — H353112 Nonexudative age-related macular degeneration, right eye, intermediate dry stage: Secondary | ICD-10-CM

## 2020-01-21 DIAGNOSIS — H3581 Retinal edema: Secondary | ICD-10-CM

## 2020-01-21 DIAGNOSIS — H353221 Exudative age-related macular degeneration, left eye, with active choroidal neovascularization: Secondary | ICD-10-CM | POA: Diagnosis not present

## 2020-01-21 MED ORDER — BEVACIZUMAB CHEMO INJECTION 1.25MG/0.05ML SYRINGE FOR KALEIDOSCOPE
1.2500 mg | INTRAVITREAL | Status: AC | PRN
  Administered 2020-01-21: 1.25 mg via INTRAVITREAL

## 2020-02-07 DIAGNOSIS — I10 Essential (primary) hypertension: Secondary | ICD-10-CM | POA: Diagnosis not present

## 2020-02-07 DIAGNOSIS — E7800 Pure hypercholesterolemia, unspecified: Secondary | ICD-10-CM | POA: Diagnosis not present

## 2020-02-07 DIAGNOSIS — M159 Polyosteoarthritis, unspecified: Secondary | ICD-10-CM | POA: Diagnosis not present

## 2020-03-05 DIAGNOSIS — M25519 Pain in unspecified shoulder: Secondary | ICD-10-CM | POA: Diagnosis not present

## 2020-03-05 DIAGNOSIS — E785 Hyperlipidemia, unspecified: Secondary | ICD-10-CM | POA: Diagnosis not present

## 2020-03-05 DIAGNOSIS — Z299 Encounter for prophylactic measures, unspecified: Secondary | ICD-10-CM | POA: Diagnosis not present

## 2020-03-05 DIAGNOSIS — I10 Essential (primary) hypertension: Secondary | ICD-10-CM | POA: Diagnosis not present

## 2020-03-05 DIAGNOSIS — J040 Acute laryngitis: Secondary | ICD-10-CM | POA: Diagnosis not present

## 2020-03-05 DIAGNOSIS — Z6822 Body mass index (BMI) 22.0-22.9, adult: Secondary | ICD-10-CM | POA: Diagnosis not present

## 2020-03-13 DIAGNOSIS — M159 Polyosteoarthritis, unspecified: Secondary | ICD-10-CM | POA: Diagnosis not present

## 2020-03-13 DIAGNOSIS — I10 Essential (primary) hypertension: Secondary | ICD-10-CM | POA: Diagnosis not present

## 2020-03-13 DIAGNOSIS — E7800 Pure hypercholesterolemia, unspecified: Secondary | ICD-10-CM | POA: Diagnosis not present

## 2020-03-13 NOTE — Progress Notes (Signed)
[facility] Note  03/17/2020     CHIEF COMPLAINT Patient presents for Retina Follow Up   HISTORY OF PRESENT ILLNESS: Jacob Oliver is a 86 y.o. male who presents to the clinic today for:   HPI    Retina Follow Up    Patient presents with  Wet AMD.  In left eye.  This started weeks ago.  Severity is moderate.  Duration of weeks.  Since onset it is stable.  I, the attending physician,  performed the HPI with the patient and updated documentation appropriately.          Comments    Pt states vision is the same OU.  Pt denies eye pain or discomfort and denies any new or worsening floaters or fol OU.       Last edited by Bernarda Caffey, MD on 03/17/2020  2:15 PM. (History)    Patient states   Referring physician: No referring provider defined for this encounter.  HISTORICAL INFORMATION:   Selected notes from the MEDICAL RECORD NUMBER Referred by Dr. Leander Rams for concern of Exudative AMD OS;  LEE- 02.21.19 ([facility]. [facility]) [BCVA OD: 20/30 OS:20/20] Ocular Hx- pseudophakia OU ([facility] 10+ years ago) PMH-     CURRENT MEDICATIONS: No current outpatient medications on file. (Ophthalmic Drugs)   No current facility-administered medications for this visit. (Ophthalmic Drugs)   Current Outpatient Medications (Other)  Medication Sig  . Calcium Carb-Ergocalciferol 500-200 MG-UNIT TABS Take by mouth.  Mariane Baumgarten Sodium 150 MG/15ML syrup Take 50 mg by mouth.  . ferrous sulfate 220 (44 Fe) MG/5ML solution Take 220 mg by mouth.  Marland Kitchen omeprazole (PRILOSEC) 20 MG capsule Take 20 mg by mouth.  . vitamin B-12 (CYANOCOBALAMIN) 500 MCG tablet Take by mouth.  . Vitamin D, Ergocalciferol, (DRISDOL) 50000 units CAPS capsule Take by mouth.   Current Facility-Administered Medications (Other)  Medication Route  . Bevacizumab (AVASTIN) SOLN 1.25 mg Intravitreal  . Bevacizumab (AVASTIN) SOLN 1.25 mg Intravitreal  . Bevacizumab (AVASTIN) SOLN 1.25 mg Intravitreal  .  Bevacizumab (AVASTIN) SOLN 1.25 mg Intravitreal  . Bevacizumab (AVASTIN) SOLN 1.25 mg Intravitreal      REVIEW OF SYSTEMS: ROS    Positive for: Gastrointestinal, HENT, Eyes   Negative for: Constitutional, Neurological, Skin, Genitourinary, Musculoskeletal, Endocrine, Cardiovascular, Respiratory, Psychiatric, Allergic/Imm, Heme/Lymph   Last edited by Doneen Poisson on 03/17/2020  1:58 PM. (History)       ALLERGIES No Known Allergies  PAST MEDICAL HISTORY Past Medical History:  Diagnosis Date  . Macular degeneration    Wet OS, Dry OD   Past Surgical History:  Procedure Laterality Date  . CATARACT EXTRACTION Bilateral 2009/2002  . EYE SURGERY      FAMILY HISTORY Family History  Problem Relation Age of Onset  . Macular degeneration Mother   . Cataracts Mother   . Macular degeneration Sister     SOCIAL HISTORY Social History   Tobacco Use  . Smoking status: Former Research scientist (life sciences)  . Smokeless tobacco: Never Used  Vaping Use  . Vaping Use: Never used  Substance Use Topics  . Alcohol use: Not on file  . Drug use: Not on file         OPHTHALMIC EXAM:  Base Eye Exam    Visual Acuity (Snellen - Linear)      Right Left   Dist cc 20/30 -1 20/30 -1   Correction: Glasses       Tonometry (Tonopen, 2:04 PM)  Right Left   Pressure 20 20       Pupils      Dark Light Shape React APD   Right 5 4 Round Brisk 0   Left 5 4 Irregular Brisk 0       Visual Fields      Left Right    Full Full       Extraocular Movement      Right Left    Full Full       Neuro/Psych    Oriented x3: Yes   Mood/Affect: Normal       Dilation    Both eyes: 1.0% Mydriacyl, 2.5% Phenylephrine @ 2:04 PM        Slit Lamp and Fundus Exam    Slit Lamp Exam      Right Left   Lids/Lashes Telangiectasia, Dermatochalasis - upper lid Telangiectasia   Conjunctiva/Sclera Conjunctivochalasis White and quiet   Cornea Arcus, 1+ Punctate epithelial erosions, trace Descemet's folds  Arcus, 1+ inferior Punctate epithelial erosions, mild irregular epithelium inferiorly   Anterior Chamber Deep and quiet Deep and quiet   Iris Round and moderately dilated Round and dilated   Lens PC IOL in good position, open PC PC IOL in good position, clear PC   Vitreous Vitreous syneresis, Posterior vitreous detachment syneresis, Posterior vitreous detachment       Fundus Exam      Right Left   Disc Mild Pallor, Sharp rim, Compact Mild Pallor, Sharp rim, Compact   C/D Ratio 0.2 0.2   Macula Flat, good foveal reflex, mild Retinal pigment epithelial mottling and clumping, No heme or edema Blunted foveal reflex, focal MA temporal to fovea, tr cystic changes--improved, pigment clumping nasal to fovea, mild ERM   Vessels Vascular attenuation Vascular attenuation, mild Tortuousity   Periphery Attached, mild reticular degeneration, No heme  Attached, mid-zonal drusen, mild reticular degeneration, No heme         Refraction    Wearing Rx      Sphere Cylinder Axis   Right [place] +1.25 007   Left -1.50 +2.00 177          IMAGING AND PROCEDURES  Imaging and Procedures for 09/29/17  OCT, Retina - OU - Both Eyes       Right Eye Quality was good. Central Foveal Thickness: 258. Progression has been stable. Findings include normal foveal contour, no IRF, no SRF, retinal drusen , epiretinal membrane (Mild ERM).   Left Eye Quality was good. Central Foveal Thickness: 254. Progression has improved. Findings include abnormal foveal contour, retinal drusen , outer retinal atrophy, intraretinal fluid, intraretinal hyper-reflective material, no SRF (Mild interval improvement in cystic changes and IRHM).   Notes *Images captured and stored on drive  Diagnosis / Impression:  OD: retinal drusen, mild ERM OS: exudative ARMD; Mild interval improvement in cystic changes and IRHM  Clinical management:  See below  Abbreviations: NFP - Normal foveal profile. CME - cystoid macular edema. PED -  pigment epithelial detachment. IRF - intraretinal fluid. SRF - subretinal fluid. EZ - ellipsoid zone. ERM - epiretinal membrane. ORA - outer retinal atrophy. ORT - outer retinal tubulation. SRHM - subretinal hyper-reflective material         Intravitreal Injection, Pharmacologic Agent - OS - Left Eye       Time Out 03/17/2020. 2:20 PM. Confirmed correct patient, procedure, site, and patient consented.   Anesthesia Topical anesthesia was used. Anesthetic medications included Lidocaine 2%, Proparacaine 0.5%.   Procedure Preparation included  5% betadine to ocular surface, eyelid speculum. A (32g) needle was used.   Injection:  1.25 mg Bevacizumab (AVASTIN) SOLN   NDC: 37858-850-27, Lot: [id number], Expiration date: 04/20/2020   Route: Intravitreal, Site: Left Eye, Waste: 0.05 mL  Post-op Post injection exam found visual acuity of at least counting fingers. The patient tolerated the procedure well. There were no complications. The patient received written and verbal post procedure care education. Post injection medications were not given.                 ASSESSMENT/PLAN:    ICD-10-CM   1. Exudative age-related macular degeneration of left eye with active choroidal neovascularization (HCC)  H35.3221 Intravitreal Injection, Pharmacologic Agent - OS - Left Eye    Bevacizumab (AVASTIN) SOLN 1.25 mg  2. Retinal edema  H35.81 OCT, Retina - OU - Both Eyes  3. Intermediate stage nonexudative age-related macular degeneration of right eye  H35.3112   4. Pseudophakia of both eyes  Z96.1   5. PCO (posterior capsular opacification), right  H26.491     1,2. Exudative age related macular degeneration OS.    - S/P IVA OS #1 (07.23.19), #2 (08.20.19), #3 (09.17.19), #4 (10.23.19), #5 (12.03.19), #6 (11.23.20), #7 (01.07.21), #8 (02.22.21), #9 (04.19.21), #10 (06.07.21), #11 (08.09.21)  - most recent FA (05/2018) shows mild CNV, possible RAP lesion temporal macula -- mild interval decrease in  leakage  - OCT today shows mild interval improvement in cystic changes and IRHM  - BCVA OS stale at 20/30  - recommend IVA OS #12 today, 10.04.21 w/ f/u in 10-12 wks  - pt wishes to proceed  - RBA of procedure discussed, questions answered  - informed consent obtained   - Avastin informed consent form signed and scanned on 01.07.2021  - see procedure note  - f/u 10-12 wks, DFE, OCT, possible injection OS; tx and ext as able  3. Age related macular degeneration, non-exudative, right eye  - The incidence, anatomy, and pathology of dry AMD, risk of progression, and the AREDS and AREDS 2 study including smoking risks discussed with patient.  - continue amsler grid monitoring  4. Pseudophakia OU-   - s/p CE/IOL OU  - beautiful surgeries, doing well  - stable, monitor   Ophthalmic Meds Ordered this visit:  Meds ordered this encounter  Medications  . Bevacizumab (AVASTIN) SOLN 1.25 mg       Return in about 10 weeks (around 05/26/2020) for ex AMD OS - Dilated Exam, OCT, Possible Injxn.  There are no Patient Instructions on file for this visit.  This document serves as a record of services personally performed by Gardiner Sleeper, MD, PhD. It was created on their behalf by Leeann Must, [place], an ophthalmic technician. The creation of this record is the provider's dictation and/or activities during the visit.    Electronically signed by: Leeann Must, [place] 09.30.2021 4:54 PM   This document serves as a record of services personally performed by Gardiner Sleeper, MD, PhD. It was created on their behalf by San Jetty. Owens Shark, OA an ophthalmic technician. The creation of this record is the provider's dictation and/or activities during the visit.    Electronically signed by: San Jetty. Owens Shark, [place] 10.04.2021  4:54 PM  Gardiner Sleeper, M.D., Ph.D. Diseases & Surgery of the Retina and [place] 03/17/2020   I have reviewed the above documentation for accuracy  and completeness, and I agree with the above. Gardiner Sleeper, M.D.,  Ph.D. 03/17/20 4:54 PM  Abbreviations: M myopia (nearsighted); A astigmatism; H hyperopia (farsighted); P presbyopia; Mrx spectacle prescription;  CTL contact lenses; OD right eye; OS left eye; OU both eyes  XT exotropia; ET esotropia; PEK punctate epithelial keratitis; PEE punctate epithelial erosions; DES dry eye syndrome; MGD meibomian gland dysfunction; ATs artificial tears; PFAT's preservative free artificial tears; [place] nuclear sclerotic cataract; PSC posterior subcapsular cataract; ERM epi-retinal membrane; PVD posterior vitreous detachment; RD retinal detachment; DM diabetes mellitus; DR diabetic retinopathy; NPDR non-proliferative diabetic retinopathy; PDR proliferative diabetic retinopathy; CSME clinically significant macular edema; DME diabetic macular edema; dbh dot blot hemorrhages; CWS cotton wool spot; POAG primary open angle glaucoma; C/D cup-to-disc ratio; HVF humphrey visual field; GVF goldmann visual field; OCT optical coherence tomography; IOP intraocular pressure; BRVO Branch retinal vein occlusion; CRVO central retinal vein occlusion; CRAO central retinal artery occlusion; BRAO branch retinal artery occlusion; RT retinal tear; SB scleral buckle; PPV pars plana vitrectomy; VH Vitreous hemorrhage; PRP panretinal laser photocoagulation; IVK intravitreal kenalog; VMT vitreomacular traction; MH Macular hole;  NVD neovascularization of the disc; NVE neovascularization elsewhere; AREDS age related eye disease study; ARMD age related macular degeneration; POAG primary open angle glaucoma; EBMD epithelial/anterior basement membrane dystrophy; ACIOL anterior chamber intraocular lens; IOL intraocular lens; PCIOL posterior chamber intraocular lens; Phaco/IOL phacoemulsification with intraocular lens placement; [place] photorefractive keratectomy; LASIK laser assisted in situ keratomileusis; HTN hypertension; DM diabetes mellitus; COPD  chronic obstructive pulmonary disease

## 2020-03-17 ENCOUNTER — Encounter (INDEPENDENT_AMBULATORY_CARE_PROVIDER_SITE_OTHER): Payer: Self-pay | Admitting: Ophthalmology

## 2020-03-17 ENCOUNTER — Ambulatory Visit (INDEPENDENT_AMBULATORY_CARE_PROVIDER_SITE_OTHER): Payer: Medicare Other | Admitting: Ophthalmology

## 2020-03-17 ENCOUNTER — Other Ambulatory Visit: Payer: Self-pay

## 2020-03-17 DIAGNOSIS — Z961 Presence of intraocular lens: Secondary | ICD-10-CM

## 2020-03-17 DIAGNOSIS — H353112 Nonexudative age-related macular degeneration, right eye, intermediate dry stage: Secondary | ICD-10-CM | POA: Diagnosis not present

## 2020-03-17 DIAGNOSIS — H26491 Other secondary cataract, right eye: Secondary | ICD-10-CM | POA: Diagnosis not present

## 2020-03-17 DIAGNOSIS — H3581 Retinal edema: Secondary | ICD-10-CM

## 2020-03-17 DIAGNOSIS — H353221 Exudative age-related macular degeneration, left eye, with active choroidal neovascularization: Secondary | ICD-10-CM | POA: Diagnosis not present

## 2020-03-17 MED ORDER — BEVACIZUMAB CHEMO INJECTION 1.25MG/0.05ML SYRINGE FOR KALEIDOSCOPE
1.2500 mg | INTRAVITREAL | Status: AC | PRN
  Administered 2020-03-17: 1.25 mg via INTRAVITREAL

## 2020-04-11 DIAGNOSIS — E7800 Pure hypercholesterolemia, unspecified: Secondary | ICD-10-CM | POA: Diagnosis not present

## 2020-04-11 DIAGNOSIS — M159 Polyosteoarthritis, unspecified: Secondary | ICD-10-CM | POA: Diagnosis not present

## 2020-04-11 DIAGNOSIS — I10 Essential (primary) hypertension: Secondary | ICD-10-CM | POA: Diagnosis not present

## 2020-05-13 DIAGNOSIS — E7800 Pure hypercholesterolemia, unspecified: Secondary | ICD-10-CM | POA: Diagnosis not present

## 2020-05-13 DIAGNOSIS — M159 Polyosteoarthritis, unspecified: Secondary | ICD-10-CM | POA: Diagnosis not present

## 2020-05-13 DIAGNOSIS — I10 Essential (primary) hypertension: Secondary | ICD-10-CM | POA: Diagnosis not present

## 2020-05-22 NOTE — Progress Notes (Addendum)
[facility] Note  05/26/2020     CHIEF COMPLAINT Patient presents for Retina Follow Up   HISTORY OF PRESENT ILLNESS: Jacob Oliver is a 86 y.o. male who presents to the clinic today for:   HPI    Retina Follow Up    Patient presents with  Wet AMD.  In both eyes.  This started weeks ago.  Severity is moderate.  Duration of weeks.  Since onset it is stable.  I, the attending physician,  performed the HPI with the patient and updated documentation appropriately.          Comments    Pt states vision is the same.  Pt states OS is hurting today.  Pt denies any new or worsening floaters or fol OU.       Last edited by Bernarda Caffey, MD on 05/26/2020  9:13 PM. (History)    Patient states his eyes have been watering recently so he prefers not to have an injection today, he has been using Visine and states they are "not that bad right now"   Referring physician: No referring provider defined for this encounter.  HISTORICAL INFORMATION:   Selected notes from the MEDICAL RECORD NUMBER Referred by Dr. Leander Rams for concern of Exudative AMD OS;  LEE- 02.21.19 ([facility]) [BCVA OD: 20/30 OS:20/20] Ocular Hx- pseudophakia OU ([facility] 10+ years ago) PMH-     CURRENT MEDICATIONS: No current outpatient medications on file. (Ophthalmic Drugs)   No current facility-administered medications for this visit. (Ophthalmic Drugs)   Current Outpatient Medications (Other)  Medication Sig  . Calcium Carb-Ergocalciferol 500-200 MG-UNIT TABS Take by mouth.  Mariane Baumgarten Sodium 150 MG/15ML syrup Take 50 mg by mouth.  . ferrous sulfate 220 (44 Fe) MG/5ML solution Take 220 mg by mouth.  Marland Kitchen omeprazole (PRILOSEC) 20 MG capsule Take 20 mg by mouth.  . vitamin B-12 (CYANOCOBALAMIN) 500 MCG tablet Take by mouth.  . Vitamin D, Ergocalciferol, (DRISDOL) 50000 units CAPS capsule Take by mouth.   Current Facility-Administered Medications (Other)  Medication Route  .  Bevacizumab (AVASTIN) SOLN 1.25 mg Intravitreal  . Bevacizumab (AVASTIN) SOLN 1.25 mg Intravitreal  . Bevacizumab (AVASTIN) SOLN 1.25 mg Intravitreal  . Bevacizumab (AVASTIN) SOLN 1.25 mg Intravitreal  . Bevacizumab (AVASTIN) SOLN 1.25 mg Intravitreal      REVIEW OF SYSTEMS: ROS    Positive for: Gastrointestinal, HENT, Eyes   Negative for: Constitutional, Neurological, Skin, Genitourinary, Musculoskeletal, Endocrine, Cardiovascular, Respiratory, Psychiatric, Allergic/Imm, Heme/Lymph   Last edited by Doneen Poisson on 05/26/2020  2:09 PM. (History)       ALLERGIES No Known Allergies  PAST MEDICAL HISTORY Past Medical History:  Diagnosis Date  . Macular degeneration    Wet OS, Dry OD   Past Surgical History:  Procedure Laterality Date  . CATARACT EXTRACTION Bilateral 2009/2002  . EYE SURGERY      FAMILY HISTORY Family History  Problem Relation Age of Onset  . Macular degeneration Mother   . Cataracts Mother   . Macular degeneration Sister     SOCIAL HISTORY Social History   Tobacco Use  . Smoking status: Former Research scientist (life sciences)  . Smokeless tobacco: Never Used  Vaping Use  . Vaping Use: Never used         OPHTHALMIC EXAM:  Base Eye Exam    Visual Acuity (Snellen - Linear)      Right Left   Dist cc 20/30 -2 20/30 -2   Dist ph cc  20/25 -2 NI   Correction: Glasses       Tonometry (Tonopen, 2:20 PM)      Right Left   Pressure 23 19       Pupils      Dark Light Shape React APD   Right 6 5 Round Brisk 0   Left 6 5 Round Brisk 0       Visual Fields      Left Right    Full Full       Extraocular Movement      Right Left    Full Full       Neuro/Psych    Oriented x3: Yes   Mood/Affect: Normal       Dilation    Both eyes: 1.0% Mydriacyl, 2.5% Phenylephrine @ 2:20 PM        Slit Lamp and Fundus Exam    Slit Lamp Exam      Right Left   Lids/Lashes Telangiectasia, Dermatochalasis - upper lid Telangiectasia   Conjunctiva/Sclera  Conjunctivochalasis White and quiet   Cornea Arcus, 1+ Punctate epithelial erosions, trace Descemet's folds Arcus, 1+ inferior Punctate epithelial erosions   Anterior Chamber Deep and quiet Deep and quiet   Iris Round and moderately dilated Round and dilated   Lens PC IOL in good position, open PC PC IOL in good position, open PC   Vitreous Vitreous syneresis, Posterior vitreous detachment syneresis, Posterior vitreous detachment       Fundus Exam      Right Left   Disc Mild Pallor, Sharp rim, Compact Mild Pallor, Sharp rim, Compact   C/D Ratio 0.2 0.2   Macula Flat, good foveal reflex, mild Retinal pigment epithelial mottling and clumping, No heme or edema Blunted foveal reflex, focal MA temporal to fovea, tr cystic changes--persistent, pigment clumping nasal to fovea, mild ERM   Vessels Vascular attenuation attenuated, mild Tortuousity, AV crossing changes   Periphery Attached, mild reticular degeneration, No heme  Attached, mid-zonal drusen, mild reticular degeneration, No heme         Refraction    Wearing Rx      Sphere Cylinder Axis   Right [place] +1.25 007   Left -1.50 +2.00 177          IMAGING AND PROCEDURES  Imaging and Procedures for 09/29/17  OCT, Retina - OU - Both Eyes       Right Eye Quality was good. Central Foveal Thickness: 258. Progression has been stable. Findings include normal foveal contour, no IRF, no SRF, retinal drusen , epiretinal membrane (Mild ERM).   Left Eye Quality was good. Central Foveal Thickness: 254. Progression has been stable. Findings include abnormal foveal contour, retinal drusen , outer retinal atrophy, intraretinal fluid, intraretinal hyper-reflective material, no SRF (persistent cystic changes and IRHM).   Notes *Images captured and stored on drive  Diagnosis / Impression:  OD: retinal drusen, mild ERM OS: exudative ARMD; persistent cystic changes and IRHM  Clinical management:  See below  Abbreviations: NFP - Normal  foveal profile. CME - cystoid macular edema. PED - pigment epithelial detachment. IRF - intraretinal fluid. SRF - subretinal fluid. EZ - ellipsoid zone. ERM - epiretinal membrane. ORA - outer retinal atrophy. ORT - outer retinal tubulation. SRHM - subretinal hyper-reflective material                  ASSESSMENT/PLAN:    ICD-10-CM   1. Exudative age-related macular degeneration of left eye with active choroidal neovascularization ([place])  S50.5397  2. Retinal edema  H35.81 OCT, Retina - OU - Both Eyes  3. Intermediate stage nonexudative age-related macular degeneration of right eye  H35.3112   4. Pseudophakia of both eyes  Z96.1     1,2. Exudative age related macular degeneration OS.    - S/P IVA OS #1 (07.23.19), #2 (08.20.19), #3 (09.17.19), #4 (10.23.19), #5 (12.03.19), #6 (11.23.20), #7 (01.07.21), #8 (02.22.21), #9 (04.19.21), #10 (06.07.21), #11 (08.09.21), #12 (10.04.21)  - most recent FA (05/2018) shows mild CNV, possible RAP lesion temporal macula -- mild interval decrease in leakage  - OCT today shows persistent cystic changes and IRHM  - BCVA OS stable at 20/30  - recommend IVA OS #13 today, 12.13.21   - pt does not want to proceed with injection today  - Avastin informed consent form signed and scanned on 01.07.2021  - f/u 4-6 wks, DFE, OCT, possible injection OS; tx and ext as able  3. Age related macular degeneration, non-exudative, right eye  - The incidence, anatomy, and pathology of dry AMD, risk of progression, and the AREDS and AREDS 2 study including smoking risks discussed with patient.  - continue amsler grid monitoring  4. Pseudophakia OU-   - s/p CE/IOL OU  - beautiful surgeries, doing well  - stable, monitor   Ophthalmic Meds Ordered this visit:  No orders of the defined types were placed in this encounter.      Return for f/u 4-6 weeks, exu ARMD OS, DFE, OCT.  There are no Patient Instructions on file for this visit.  This document serves as a  record of services personally performed by Gardiner Sleeper, MD, PhD. It was created on their behalf by Leeann Must, [place], an ophthalmic technician. The creation of this record is the provider's dictation and/or activities during the visit.    Electronically signed by: Leeann Must, [place] 12.09.2021 9:34 PM   This document serves as a record of services personally performed by Gardiner Sleeper, MD, PhD. It was created on their behalf by San Jetty. Owens Shark, OA an ophthalmic technician. The creation of this record is the provider's dictation and/or activities during the visit.    Electronically signed by: San Jetty. Marguerita Merles 12.13.2021 9:34 PM  Gardiner Sleeper, M.D., Ph.D. Diseases & Surgery of the Retina and [place] 05/26/2020   I have reviewed the above documentation for accuracy and completeness, and I agree with the above. Gardiner Sleeper, M.D., Ph.D. 05/26/20 9:34 PM   Abbreviations: M myopia (nearsighted); A astigmatism; H hyperopia (farsighted); P presbyopia; Mrx spectacle prescription;  CTL contact lenses; OD right eye; OS left eye; OU both eyes  XT exotropia; ET esotropia; PEK punctate epithelial keratitis; PEE punctate epithelial erosions; DES dry eye syndrome; MGD meibomian gland dysfunction; ATs artificial tears; PFAT's preservative free artificial tears; [place] nuclear sclerotic cataract; PSC posterior subcapsular cataract; ERM epi-retinal membrane; PVD posterior vitreous detachment; RD retinal detachment; DM diabetes mellitus; DR diabetic retinopathy; NPDR non-proliferative diabetic retinopathy; PDR proliferative diabetic retinopathy; CSME clinically significant macular edema; DME diabetic macular edema; dbh dot blot hemorrhages; CWS cotton wool spot; POAG primary open angle glaucoma; C/D cup-to-disc ratio; HVF humphrey visual field; GVF goldmann visual field; OCT optical coherence tomography; IOP intraocular pressure; BRVO Branch retinal vein occlusion;  CRVO central retinal vein occlusion; CRAO central retinal artery occlusion; BRAO branch retinal artery occlusion; RT retinal tear; SB scleral buckle; PPV pars plana vitrectomy; VH Vitreous hemorrhage; PRP panretinal laser photocoagulation; IVK intravitreal kenalog; VMT  vitreomacular traction; MH Macular hole;  NVD neovascularization of the disc; NVE neovascularization elsewhere; AREDS age related eye disease study; ARMD age related macular degeneration; POAG primary open angle glaucoma; EBMD epithelial/anterior basement membrane dystrophy; ACIOL anterior chamber intraocular lens; IOL intraocular lens; PCIOL posterior chamber intraocular lens; Phaco/IOL phacoemulsification with intraocular lens placement; [place] photorefractive keratectomy; LASIK laser assisted in situ keratomileusis; HTN hypertension; DM diabetes mellitus; COPD chronic obstructive pulmonary disease

## 2020-05-26 ENCOUNTER — Encounter (INDEPENDENT_AMBULATORY_CARE_PROVIDER_SITE_OTHER): Payer: Self-pay | Admitting: Ophthalmology

## 2020-05-26 ENCOUNTER — Other Ambulatory Visit: Payer: Self-pay

## 2020-05-26 ENCOUNTER — Ambulatory Visit (INDEPENDENT_AMBULATORY_CARE_PROVIDER_SITE_OTHER): Payer: Medicare Other | Admitting: Ophthalmology

## 2020-05-26 DIAGNOSIS — H353112 Nonexudative age-related macular degeneration, right eye, intermediate dry stage: Secondary | ICD-10-CM

## 2020-05-26 DIAGNOSIS — Z961 Presence of intraocular lens: Secondary | ICD-10-CM | POA: Diagnosis not present

## 2020-05-26 DIAGNOSIS — H26491 Other secondary cataract, right eye: Secondary | ICD-10-CM

## 2020-05-26 DIAGNOSIS — H3581 Retinal edema: Secondary | ICD-10-CM

## 2020-05-26 DIAGNOSIS — H353221 Exudative age-related macular degeneration, left eye, with active choroidal neovascularization: Secondary | ICD-10-CM

## 2020-06-07 ENCOUNTER — Other Ambulatory Visit: Payer: Self-pay

## 2020-06-07 ENCOUNTER — Emergency Department (HOSPITAL_COMMUNITY): Payer: Medicare Other

## 2020-06-07 ENCOUNTER — Encounter (HOSPITAL_COMMUNITY): Payer: Self-pay | Admitting: Emergency Medicine

## 2020-06-07 ENCOUNTER — Emergency Department (HOSPITAL_COMMUNITY)
Admission: EM | Admit: 2020-06-07 | Discharge: 2020-06-07 | Disposition: A | Discharge status: Home / Self Care | Payer: Medicare Other | Attending: Emergency Medicine | Admitting: Emergency Medicine

## 2020-06-07 DIAGNOSIS — M25511 Pain in right shoulder: Secondary | ICD-10-CM | POA: Diagnosis not present

## 2020-06-07 DIAGNOSIS — Z23 Encounter for immunization: Secondary | ICD-10-CM | POA: Diagnosis not present

## 2020-06-07 DIAGNOSIS — S61312A Laceration without foreign body of right middle finger with damage to nail, initial encounter: Secondary | ICD-10-CM | POA: Diagnosis not present

## 2020-06-07 DIAGNOSIS — Z8673 Personal history of transient ischemic attack (TIA), and cerebral infarction without residual deficits: Secondary | ICD-10-CM | POA: Insufficient documentation

## 2020-06-07 DIAGNOSIS — M19011 Primary osteoarthritis, right shoulder: Secondary | ICD-10-CM | POA: Insufficient documentation

## 2020-06-07 DIAGNOSIS — W101XXA Fall (on)(from) sidewalk curb, initial encounter: Secondary | ICD-10-CM | POA: Diagnosis not present

## 2020-06-07 DIAGNOSIS — I6782 Cerebral ischemia: Secondary | ICD-10-CM | POA: Insufficient documentation

## 2020-06-07 DIAGNOSIS — S0993XA Unspecified injury of face, initial encounter: Secondary | ICD-10-CM | POA: Diagnosis not present

## 2020-06-07 DIAGNOSIS — G9389 Other specified disorders of brain: Secondary | ICD-10-CM | POA: Diagnosis not present

## 2020-06-07 DIAGNOSIS — Z87891 Personal history of nicotine dependence: Secondary | ICD-10-CM | POA: Diagnosis not present

## 2020-06-07 DIAGNOSIS — M79621 Pain in right upper arm: Secondary | ICD-10-CM | POA: Diagnosis not present

## 2020-06-07 DIAGNOSIS — S61210A Laceration without foreign body of right index finger without damage to nail, initial encounter: Secondary | ICD-10-CM | POA: Diagnosis not present

## 2020-06-07 MED ORDER — TETANUS-DIPHTH-ACELL PERTUSSIS 5-2.5-18.5 LF-MCG/0.5 IM SUSY
0.5000 mL | PREFILLED_SYRINGE | Freq: Once | INTRAMUSCULAR | Status: AC
  Administered 2020-06-07: 17:00:00 0.5 mL via INTRAMUSCULAR
  Filled 2020-06-07: qty 0.5

## 2020-06-07 NOTE — Discharge Instructions (Addendum)
You were evaluated in the Emergency Department and after careful evaluation, we did not find any emergent condition requiring admission or further testing in the hospital.  Your exam/testing today was overall reassuring.  CT scan and x-ray did not show any significant injuries or broken bones.  Suspect the shoulder is bruised and may be some arthritis has been aggravated.  Recommend Tylenol at home for discomfort.  Use of the sling as needed for comfort.  Please return to the Emergency Department if you experience any worsening of your condition.  Thank you for allowing [place] to be a part of your care.

## 2020-06-07 NOTE — ED Triage Notes (Signed)
Pt walking on to sidewalk and missed the step causing a fall to his right shoulder. Pt denies hitting head. Denies blood thinners.

## 2020-06-07 NOTE — ED Provider Notes (Signed)
[facility] Emergency Department Provider Note MRN:  [id number]  Arrival date & time: 06/07/20     Chief Complaint   Shoulder Pain and Fall   History of Present Illness   Jacob Oliver is a 86 y.o. year-old male with a history of macular degeneration, hearing loss presenting to the ED with chief complaint of fall.  Tripped while walking up a curb, struck right shoulder and right side of head against the ground.  Also with some abrasions to the right pointer and middle fingers.  Denies neck or back pain, no chest pain or shortness of breath, no abdominal pain, no injuries to the legs.  Pain in the shoulder is 1 out of 10 without movement, 10 out of 10 with movement.  Review of Systems  A complete 10 system review of systems was obtained and all systems are negative except as noted in the HPI and PMH.   Patient's Health History    Past Medical History:  Diagnosis Date  . Macular degeneration    Wet OS, Dry OD    Past Surgical History:  Procedure Laterality Date  . CATARACT EXTRACTION Bilateral 2009/2002  . EYE SURGERY      Family History  Problem Relation Age of Onset  . Macular degeneration Mother   . Cataracts Mother   . Macular degeneration Sister     Social History   Socioeconomic History  . Marital status: Married    Spouse name: Not on file  . Number of children: Not on file  . Years of education: Not on file  . Highest education level: Not on file  Occupational History  . Not on file  Tobacco Use  . Smoking status: Former Research scientist (life sciences)  . Smokeless tobacco: Never Used  Vaping Use  . Vaping Use: Never used  Substance and Sexual Activity  . Alcohol use: Not on file  . Drug use: Not on file  . Sexual activity: Not on file  Other Topics Concern  . Not on file  Social History Narrative  . Not on file   Social Determinants of Health   Financial Resource Strain: Not on file  Food Insecurity: Not on file  Transportation Needs: Not on  file  Physical Activity: Not on file  Stress: Not on file  Social Connections: Not on file  Intimate Partner Violence: Not on file     Physical Exam   Vitals:   06/07/20 1630 06/07/20 1632  BP: (!) 173/66   Pulse: 60   Temp:  97.6 F (36.4 C)  SpO2: 99%     CONSTITUTIONAL: Well-appearing, NAD NEURO:  Alert and oriented x 3, no focal deficits EYES:  eyes equal and reactive ENT/NECK:  no LAD, no JVD CARDIO: Regular rate, well-perfused, normal S1 and S2 PULM:  CTAB no wheezing or rhonchi GI/GU:  normal bowel sounds, non-distended, non-tender MSK/SPINE:  No gross deformities, no edema SKIN:  no rash, atraumatic PSYCH:  Appropriate speech and behavior  *Additional and/or pertinent findings included in MDM below  Diagnostic and Interventional Summary    EKG Interpretation  Date/Time:    Ventricular Rate:    PR Interval:    QRS Duration:   QT Interval:    QTC Calculation:   R Axis:     Text Interpretation:        Labs Reviewed - No data to display  DG Shoulder Right  Final Result    DG Humerus Right  Final Result    CT HEAD WO  CONTRAST  Final Result      Medications  Tdap (BOOSTRIX) injection 0.5 mL (0.5 mLs Intramuscular Given 06/07/20 1716)     Procedures  /  Critical Care .Marland KitchenLaceration Repair  Date/Time: 06/07/2020 5:02 PM Performed by: Maudie Flakes, MD Authorized by: Maudie Flakes, MD   Consent:    Consent obtained:  Verbal   Consent given by:  Patient   Risks, benefits, and alternatives were discussed: yes     Risks discussed:  Infection, need for additional repair, nerve damage, poor wound healing, poor cosmetic result, pain and retained foreign body Universal protocol:    Procedure explained and questions answered to patient or proxy's satisfaction: yes     Patient identity confirmed:  Verbally with patient Anesthesia:    Anesthesia method:  None Laceration details:    Location:  Finger   Finger location:  R index finger   Length  (cm):  1   Depth (mm):  2 Pre-procedure details:    Preparation:  Patient was prepped and draped in usual sterile fashion Exploration:    Limited defect created (wound extended): no     Hemostasis achieved with:  Direct pressure   Wound exploration: wound explored through full range of motion and entire depth of wound visualized     Contaminated: no   Treatment:    Area cleansed with:  Soap and water   Amount of cleaning:  Standard Skin repair:    Repair method:  Tissue adhesive Approximation:    Approximation:  Close Repair type:    Repair type:  Simple Post-procedure details:    Dressing:  Open (no dressing)   Procedure completion:  Tolerated well, no immediate complications .Marland KitchenLaceration Repair  Date/Time: 06/07/2020 5:04 PM Performed by: Maudie Flakes, MD Authorized by: Maudie Flakes, MD   Consent:    Consent obtained:  Verbal   Consent given by:  Patient   Risks, benefits, and alternatives were discussed: yes     Risks discussed:  Infection, need for additional repair, nerve damage, poor wound healing, poor cosmetic result, pain and retained foreign body Universal protocol:    Procedure explained and questions answered to patient or proxy's satisfaction: yes     Patient identity confirmed:  Verbally with patient Anesthesia:    Anesthesia method:  None Laceration details:    Location:  Finger   Finger location:  R long finger   Length (cm):  1   Depth (mm):  2 Pre-procedure details:    Preparation:  Patient was prepped and draped in usual sterile fashion Exploration:    Limited defect created (wound extended): no     Hemostasis achieved with:  Direct pressure   Wound exploration: wound explored through full range of motion and entire depth of wound visualized     Contaminated: no   Treatment:    Area cleansed with:  Soap and water   Debridement:  None   Undermining:  None   Scar revision: no   Skin repair:    Repair method:  Tissue  adhesive Approximation:    Approximation:  Close Repair type:    Repair type:  Simple Post-procedure details:    Dressing:  Open (no dressing)   Procedure completion:  Tolerated well, no immediate complications    ED Course and Medical Decision Making  I have reviewed the triage vital signs, the nursing notes, and pertinent available records from the EMR.  Listed above are laboratory and imaging tests that I personally ordered, reviewed,  and interpreted and then considered in my medical decision making (see below for details).  Mechanical fall, suspect shoulder fracture versus less likely dislocation.  Bilateral breath sounds, no increased work of breathing, no tenderness to the chest wall.  Mild head trauma but given advanced age will obtain CT head.  No neck or back tenderness to the midline, normal range of motion of the neck.  Will update tetanus.  Small flap lacerations to the distal aspects of the right fingers repaired with medical adhesive.    Imaging reassuring, appropriate for discharge.   Barth Kirks. Sedonia Small, [place] [email or web address]  Final Clinical Impressions(s) / ED Diagnoses     ICD-10-CM   1. Acute pain of right shoulder  M25.511     ED Discharge Orders    None       Discharge Instructions Discussed with and Provided to Patient:     Discharge Instructions     You were evaluated in the Emergency Department and after careful evaluation, we did not find any emergent condition requiring admission or further testing in the hospital.  Your exam/testing today was overall reassuring.  CT scan and x-ray did not show any significant injuries or broken bones.  Suspect the shoulder is bruised and may be some arthritis has been aggravated.  Recommend Tylenol at home for discomfort.  Use of the sling as needed for comfort.  Please return to the Emergency Department if you experience any worsening of your condition.   Thank you for allowing [place] to be a part of your care.        Maudie Flakes, MD 06/07/20 [phone]

## 2020-06-12 DIAGNOSIS — M159 Polyosteoarthritis, unspecified: Secondary | ICD-10-CM | POA: Diagnosis not present

## 2020-06-12 DIAGNOSIS — I10 Essential (primary) hypertension: Secondary | ICD-10-CM | POA: Diagnosis not present

## 2020-06-12 DIAGNOSIS — E7800 Pure hypercholesterolemia, unspecified: Secondary | ICD-10-CM | POA: Diagnosis not present

## 2020-06-26 DIAGNOSIS — H109 Unspecified conjunctivitis: Secondary | ICD-10-CM | POA: Diagnosis not present

## 2020-06-26 DIAGNOSIS — N1832 Chronic kidney disease, stage 3b: Secondary | ICD-10-CM | POA: Diagnosis not present

## 2020-06-26 DIAGNOSIS — H353220 Exudative age-related macular degeneration, left eye, stage unspecified: Secondary | ICD-10-CM | POA: Diagnosis not present

## 2020-06-26 DIAGNOSIS — Z87891 Personal history of nicotine dependence: Secondary | ICD-10-CM | POA: Diagnosis not present

## 2020-06-26 DIAGNOSIS — Z299 Encounter for prophylactic measures, unspecified: Secondary | ICD-10-CM | POA: Diagnosis not present

## 2020-06-26 DIAGNOSIS — I10 Essential (primary) hypertension: Secondary | ICD-10-CM | POA: Diagnosis not present

## 2020-07-07 ENCOUNTER — Encounter (INDEPENDENT_AMBULATORY_CARE_PROVIDER_SITE_OTHER): Payer: Medicare Other | Admitting: Ophthalmology

## 2020-07-09 DIAGNOSIS — M25511 Pain in right shoulder: Secondary | ICD-10-CM | POA: Diagnosis not present

## 2020-07-09 DIAGNOSIS — R42 Dizziness and giddiness: Secondary | ICD-10-CM | POA: Diagnosis not present

## 2020-07-09 DIAGNOSIS — Z299 Encounter for prophylactic measures, unspecified: Secondary | ICD-10-CM | POA: Diagnosis not present

## 2020-07-09 DIAGNOSIS — I10 Essential (primary) hypertension: Secondary | ICD-10-CM | POA: Diagnosis not present

## 2020-08-18 DIAGNOSIS — Z299 Encounter for prophylactic measures, unspecified: Secondary | ICD-10-CM | POA: Diagnosis not present

## 2020-08-18 DIAGNOSIS — R5383 Other fatigue: Secondary | ICD-10-CM | POA: Diagnosis not present

## 2020-08-18 DIAGNOSIS — Z79899 Other long term (current) drug therapy: Secondary | ICD-10-CM | POA: Diagnosis not present

## 2020-08-18 DIAGNOSIS — Z1331 Encounter for screening for depression: Secondary | ICD-10-CM | POA: Diagnosis not present

## 2020-08-18 DIAGNOSIS — E785 Hyperlipidemia, unspecified: Secondary | ICD-10-CM | POA: Diagnosis not present

## 2020-08-18 DIAGNOSIS — I10 Essential (primary) hypertension: Secondary | ICD-10-CM | POA: Diagnosis not present

## 2020-08-18 DIAGNOSIS — Z0000 Encounter for general adult medical examination without abnormal findings: Secondary | ICD-10-CM | POA: Diagnosis not present

## 2020-08-18 DIAGNOSIS — Z6821 Body mass index (BMI) 21.0-21.9, adult: Secondary | ICD-10-CM | POA: Diagnosis not present

## 2020-08-18 DIAGNOSIS — Z7189 Other specified counseling: Secondary | ICD-10-CM | POA: Diagnosis not present

## 2020-08-18 DIAGNOSIS — Z1339 Encounter for screening examination for other mental health and behavioral disorders: Secondary | ICD-10-CM | POA: Diagnosis not present

## 2020-08-19 DIAGNOSIS — E785 Hyperlipidemia, unspecified: Secondary | ICD-10-CM | POA: Diagnosis not present

## 2020-08-19 DIAGNOSIS — R5383 Other fatigue: Secondary | ICD-10-CM | POA: Diagnosis not present

## 2020-08-19 DIAGNOSIS — Z125 Encounter for screening for malignant neoplasm of prostate: Secondary | ICD-10-CM | POA: Diagnosis not present

## 2020-08-19 DIAGNOSIS — Z79899 Other long term (current) drug therapy: Secondary | ICD-10-CM | POA: Diagnosis not present

## 2020-12-02 DIAGNOSIS — I739 Peripheral vascular disease, unspecified: Secondary | ICD-10-CM | POA: Diagnosis not present

## 2020-12-02 DIAGNOSIS — M2042 Other hammer toe(s) (acquired), left foot: Secondary | ICD-10-CM | POA: Diagnosis not present

## 2020-12-02 DIAGNOSIS — L609 Nail disorder, unspecified: Secondary | ICD-10-CM | POA: Diagnosis not present

## 2020-12-02 DIAGNOSIS — M79672 Pain in left foot: Secondary | ICD-10-CM | POA: Diagnosis not present

## 2020-12-02 DIAGNOSIS — M1990 Unspecified osteoarthritis, unspecified site: Secondary | ICD-10-CM | POA: Diagnosis not present

## 2021-01-06 DIAGNOSIS — Z23 Encounter for immunization: Secondary | ICD-10-CM | POA: Diagnosis not present

## 2021-01-26 DIAGNOSIS — I10 Essential (primary) hypertension: Secondary | ICD-10-CM | POA: Diagnosis not present

## 2021-01-26 DIAGNOSIS — M25551 Pain in right hip: Secondary | ICD-10-CM | POA: Diagnosis not present

## 2021-01-26 DIAGNOSIS — N1832 Chronic kidney disease, stage 3b: Secondary | ICD-10-CM | POA: Diagnosis not present

## 2021-01-26 DIAGNOSIS — Z299 Encounter for prophylactic measures, unspecified: Secondary | ICD-10-CM | POA: Diagnosis not present

## 2021-01-26 DIAGNOSIS — Z0000 Encounter for general adult medical examination without abnormal findings: Secondary | ICD-10-CM | POA: Diagnosis not present

## 2021-01-26 DIAGNOSIS — M7500 Adhesive capsulitis of unspecified shoulder: Secondary | ICD-10-CM | POA: Diagnosis not present

## 2021-02-02 DIAGNOSIS — M19011 Primary osteoarthritis, right shoulder: Secondary | ICD-10-CM | POA: Diagnosis not present

## 2021-02-02 DIAGNOSIS — S46011D Strain of muscle(s) and tendon(s) of the rotator cuff of right shoulder, subsequent encounter: Secondary | ICD-10-CM | POA: Diagnosis not present

## 2021-02-02 DIAGNOSIS — M1711 Unilateral primary osteoarthritis, right knee: Secondary | ICD-10-CM | POA: Diagnosis not present

## 2021-03-02 DIAGNOSIS — M5136 Other intervertebral disc degeneration, lumbar region: Secondary | ICD-10-CM | POA: Diagnosis not present

## 2021-03-02 DIAGNOSIS — M25551 Pain in right hip: Secondary | ICD-10-CM | POA: Diagnosis not present

## 2021-03-02 DIAGNOSIS — M19011 Primary osteoarthritis, right shoulder: Secondary | ICD-10-CM | POA: Diagnosis not present

## 2021-03-03 DIAGNOSIS — M47896 Other spondylosis, lumbar region: Secondary | ICD-10-CM | POA: Diagnosis not present

## 2021-03-03 DIAGNOSIS — M19011 Primary osteoarthritis, right shoulder: Secondary | ICD-10-CM | POA: Diagnosis not present

## 2021-03-11 DIAGNOSIS — M47896 Other spondylosis, lumbar region: Secondary | ICD-10-CM | POA: Diagnosis not present

## 2021-03-11 DIAGNOSIS — M19011 Primary osteoarthritis, right shoulder: Secondary | ICD-10-CM | POA: Diagnosis not present

## 2021-03-18 DIAGNOSIS — M19011 Primary osteoarthritis, right shoulder: Secondary | ICD-10-CM | POA: Diagnosis not present

## 2021-03-18 DIAGNOSIS — M47896 Other spondylosis, lumbar region: Secondary | ICD-10-CM | POA: Diagnosis not present

## 2021-03-25 DIAGNOSIS — M19011 Primary osteoarthritis, right shoulder: Secondary | ICD-10-CM | POA: Diagnosis not present

## 2021-03-25 DIAGNOSIS — M47896 Other spondylosis, lumbar region: Secondary | ICD-10-CM | POA: Diagnosis not present

## 2021-05-01 DIAGNOSIS — Z299 Encounter for prophylactic measures, unspecified: Secondary | ICD-10-CM | POA: Diagnosis not present

## 2021-05-01 DIAGNOSIS — Z6821 Body mass index (BMI) 21.0-21.9, adult: Secondary | ICD-10-CM | POA: Diagnosis not present

## 2021-05-01 DIAGNOSIS — I10 Essential (primary) hypertension: Secondary | ICD-10-CM | POA: Diagnosis not present

## 2021-05-01 DIAGNOSIS — R42 Dizziness and giddiness: Secondary | ICD-10-CM | POA: Diagnosis not present

## 2021-05-01 DIAGNOSIS — J069 Acute upper respiratory infection, unspecified: Secondary | ICD-10-CM | POA: Diagnosis not present

## 2021-05-12 DIAGNOSIS — Z23 Encounter for immunization: Secondary | ICD-10-CM | POA: Diagnosis not present

## 2021-05-12 DIAGNOSIS — Z20828 Contact with and (suspected) exposure to other viral communicable diseases: Secondary | ICD-10-CM | POA: Diagnosis not present

## 2021-07-07 DIAGNOSIS — Z6821 Body mass index (BMI) 21.0-21.9, adult: Secondary | ICD-10-CM | POA: Diagnosis not present

## 2021-07-07 DIAGNOSIS — I10 Essential (primary) hypertension: Secondary | ICD-10-CM | POA: Diagnosis not present

## 2021-07-07 DIAGNOSIS — J040 Acute laryngitis: Secondary | ICD-10-CM | POA: Diagnosis not present

## 2021-07-07 DIAGNOSIS — N1832 Chronic kidney disease, stage 3b: Secondary | ICD-10-CM | POA: Diagnosis not present

## 2021-07-07 DIAGNOSIS — Z299 Encounter for prophylactic measures, unspecified: Secondary | ICD-10-CM | POA: Diagnosis not present

## 2021-08-26 DIAGNOSIS — I10 Essential (primary) hypertension: Secondary | ICD-10-CM | POA: Diagnosis not present

## 2021-08-26 DIAGNOSIS — Z0000 Encounter for general adult medical examination without abnormal findings: Secondary | ICD-10-CM | POA: Diagnosis not present

## 2021-08-26 DIAGNOSIS — Z1339 Encounter for screening examination for other mental health and behavioral disorders: Secondary | ICD-10-CM | POA: Diagnosis not present

## 2021-08-26 DIAGNOSIS — Z79899 Other long term (current) drug therapy: Secondary | ICD-10-CM | POA: Diagnosis not present

## 2021-08-26 DIAGNOSIS — R5383 Other fatigue: Secondary | ICD-10-CM | POA: Diagnosis not present

## 2021-08-26 DIAGNOSIS — Z299 Encounter for prophylactic measures, unspecified: Secondary | ICD-10-CM | POA: Diagnosis not present

## 2021-08-26 DIAGNOSIS — Z7189 Other specified counseling: Secondary | ICD-10-CM | POA: Diagnosis not present

## 2021-08-26 DIAGNOSIS — E7800 Pure hypercholesterolemia, unspecified: Secondary | ICD-10-CM | POA: Diagnosis not present

## 2021-08-26 DIAGNOSIS — Z6821 Body mass index (BMI) 21.0-21.9, adult: Secondary | ICD-10-CM | POA: Diagnosis not present

## 2021-08-26 DIAGNOSIS — Z1331 Encounter for screening for depression: Secondary | ICD-10-CM | POA: Diagnosis not present

## 2021-08-28 DIAGNOSIS — R5383 Other fatigue: Secondary | ICD-10-CM | POA: Diagnosis not present

## 2021-08-28 DIAGNOSIS — Z79899 Other long term (current) drug therapy: Secondary | ICD-10-CM | POA: Diagnosis not present

## 2021-08-28 DIAGNOSIS — Z125 Encounter for screening for malignant neoplasm of prostate: Secondary | ICD-10-CM | POA: Diagnosis not present

## 2021-08-28 DIAGNOSIS — E7800 Pure hypercholesterolemia, unspecified: Secondary | ICD-10-CM | POA: Diagnosis not present

## 2021-12-13 IMAGING — DX DG SHOULDER 2+V*R*
2 series · 2 of 2 positions shown · non-contrast
Comparison: None.

CLINICAL DATA: Fall, pain

EXAM:
RIGHT SHOULDER - 2+ VIEW; RIGHT HUMERUS - 2+ VIEW

[shoulder y view]
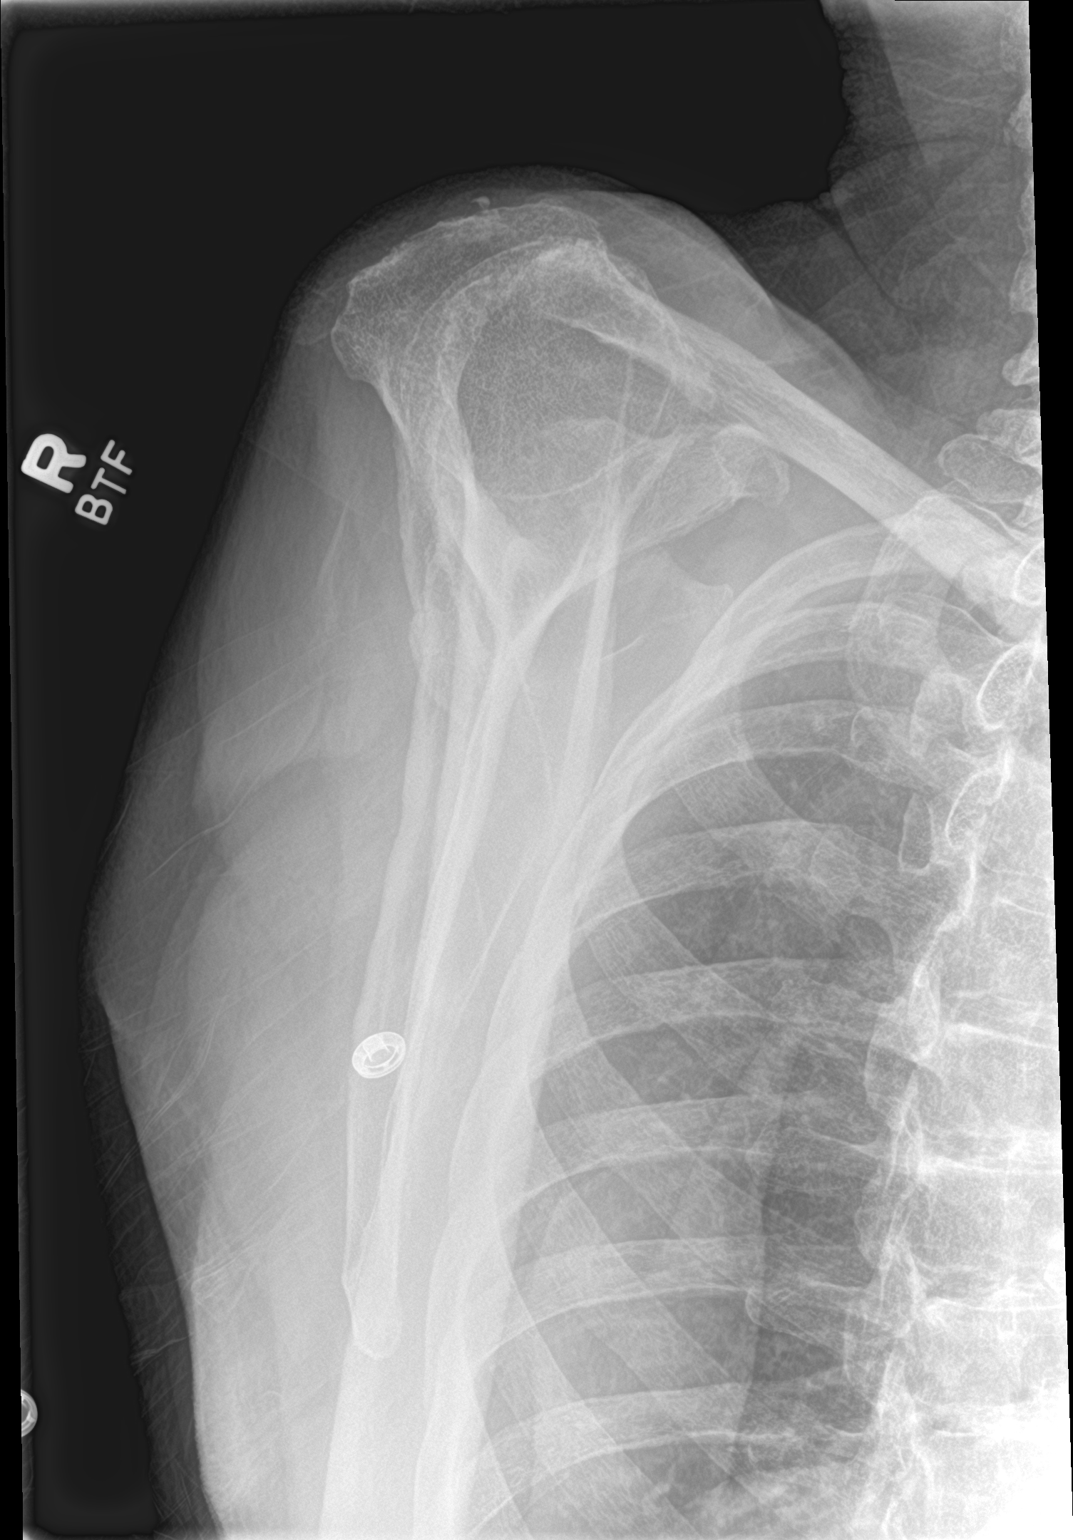

[shoulder axillary]
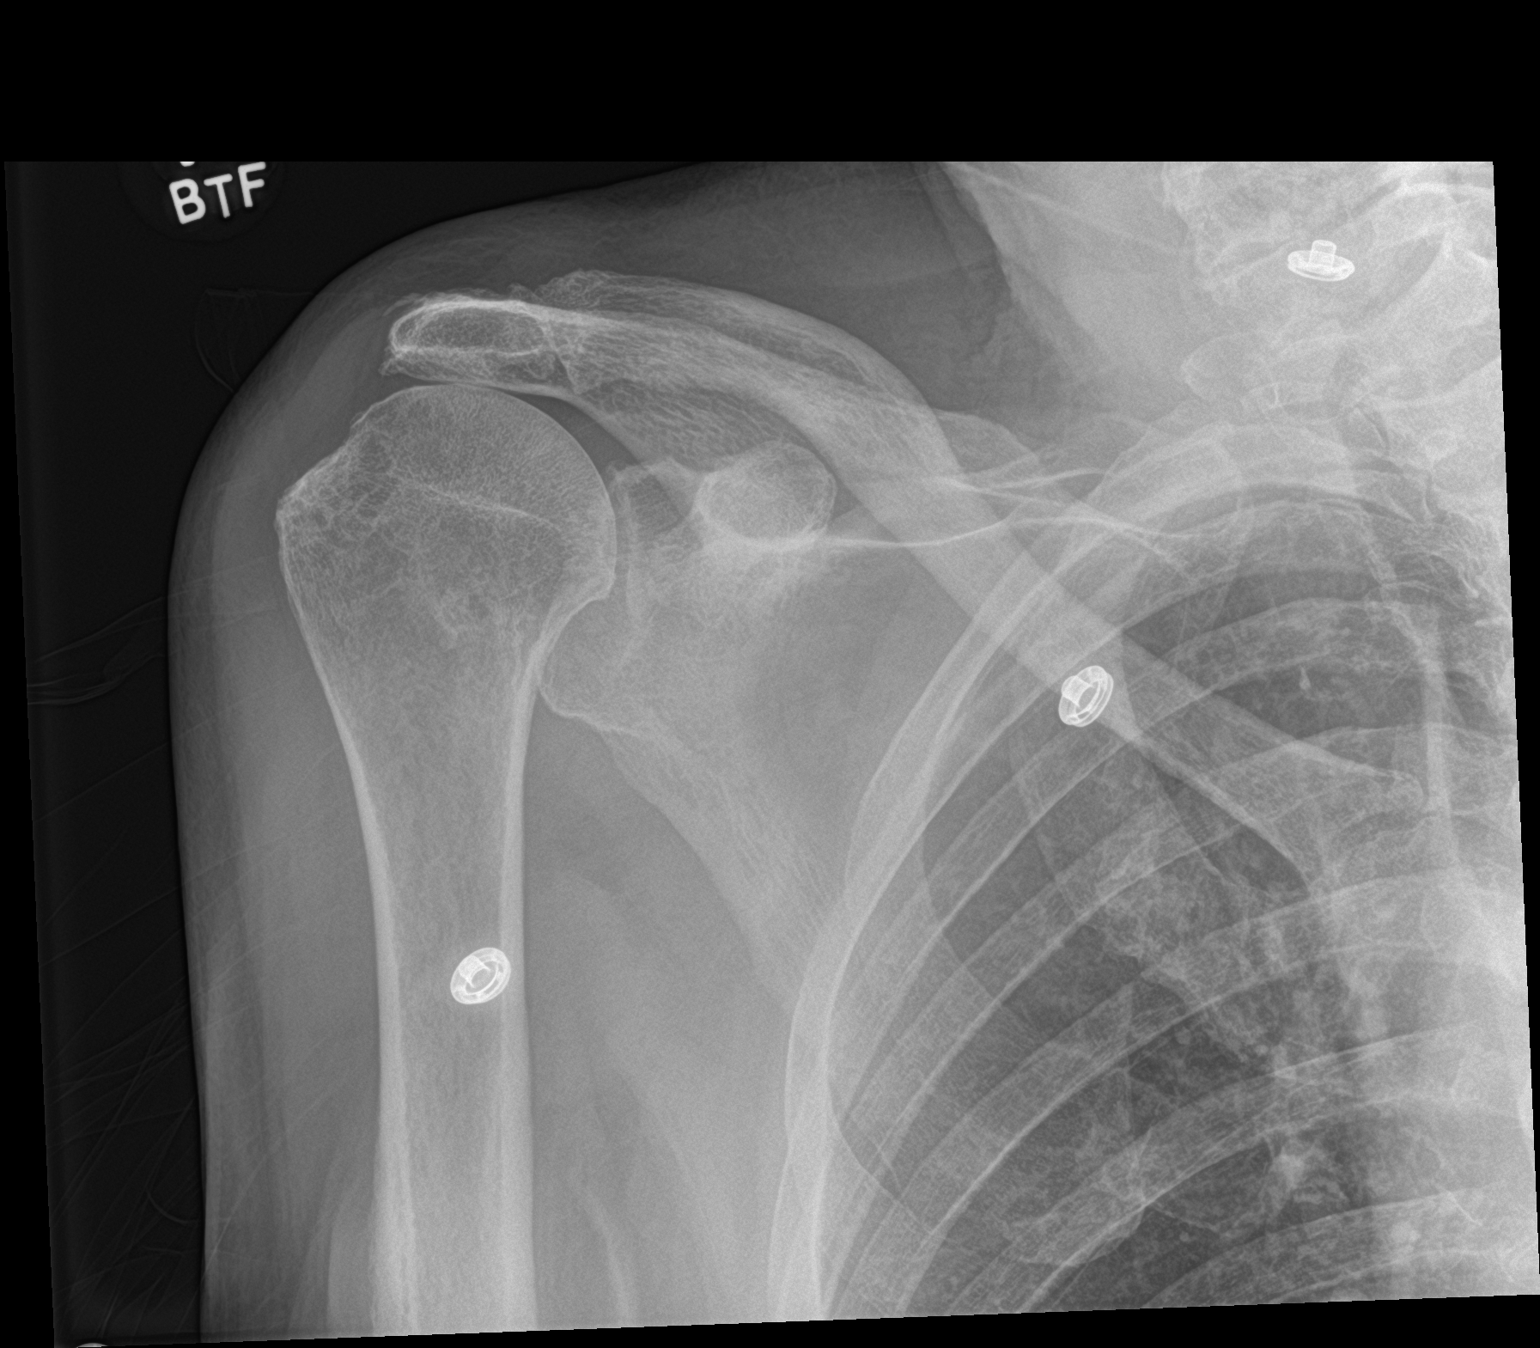

[2 of 2 positions shown; findings below may reference images not displayed]

FINDINGS: No fracture or dislocation of the right shoulder or right humerus.
There is a high riding position of the humeral head, with some
evidence of subacromial pseudoarthrosis and mild glenohumeral and
acromioclavicular arthrosis. The partially imaged right chest is
unremarkable. Soft tissues are unremarkable.
IMPRESSION: 1. No fracture or dislocation of the right shoulder or right
humerus.
2. There is a high riding position of the humeral head, with some
evidence of subacromial pseudoarthrosis, consistent with chronic
rotator cuff tear.
3. Mild glenohumeral and acromioclavicular arthrosis.

## 2021-12-13 IMAGING — CT CT HEAD W/O CM
3 series · 15 of 47 positions shown, 18 images · non-contrast
Comparison: None

CLINICAL DATA: [AGE] male with facial trauma.

EXAM:
CT HEAD WITHOUT CONTRAST
TECHNIQUE: Contiguous axial images were obtained from the base of the skull
through the vertex without intravenous contrast.

[Series 2: head w o · axial · 0.44mm/px · z∈[+31,+161]mm · 9 of 32 slices shown, 12 images]
[im 3/32  brain]
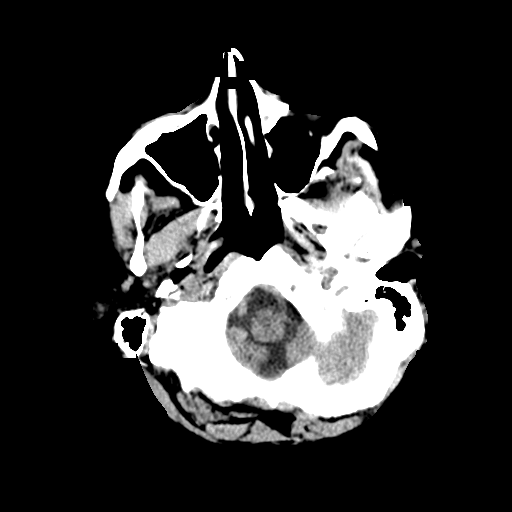
[im 3/32  bone]
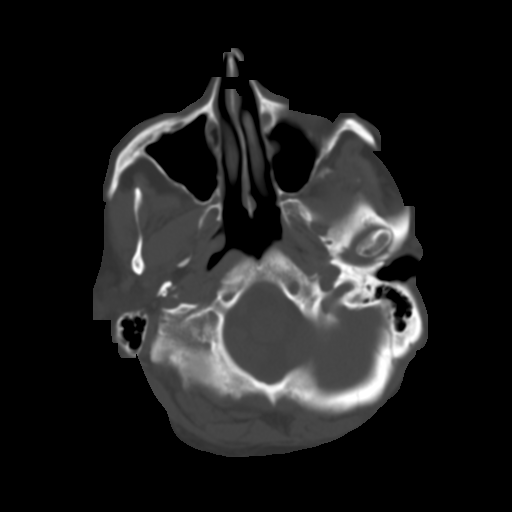
[im 6/32  brain]
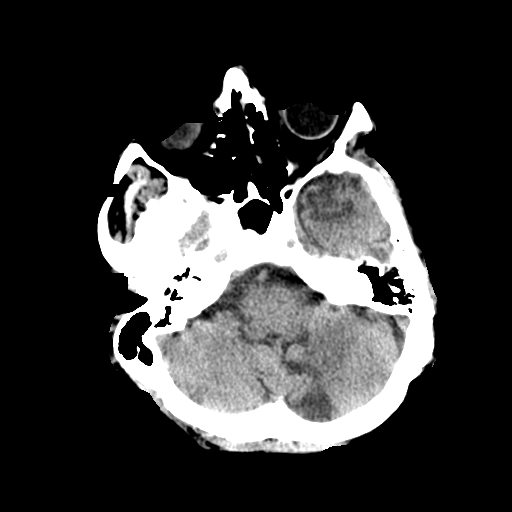
[im 9/32  brain]
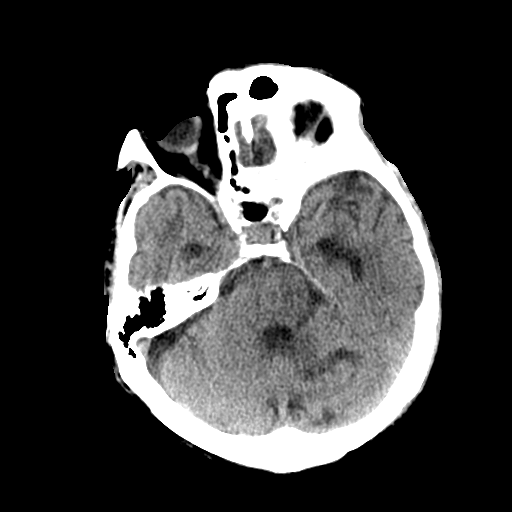
[im 12/32  brain]
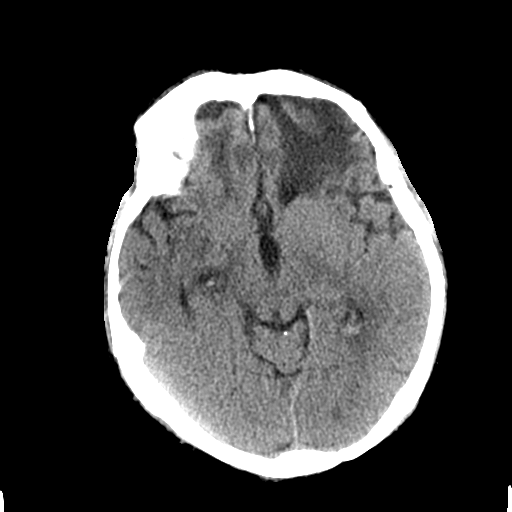
[im 17/32  brain]
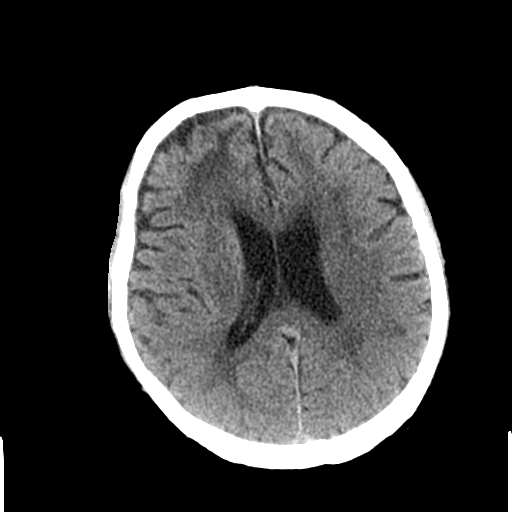
[im 17/32  bone]
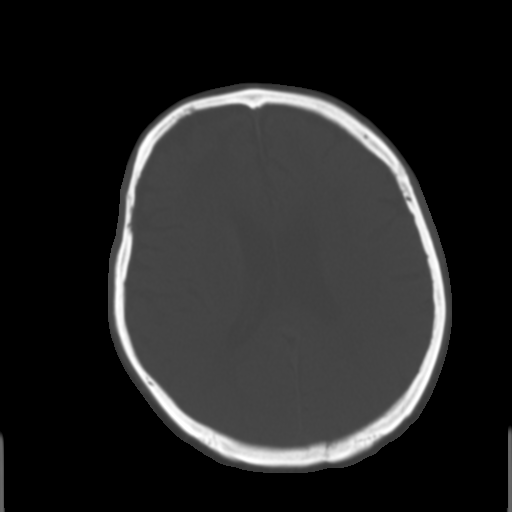
[im 20/32  brain]
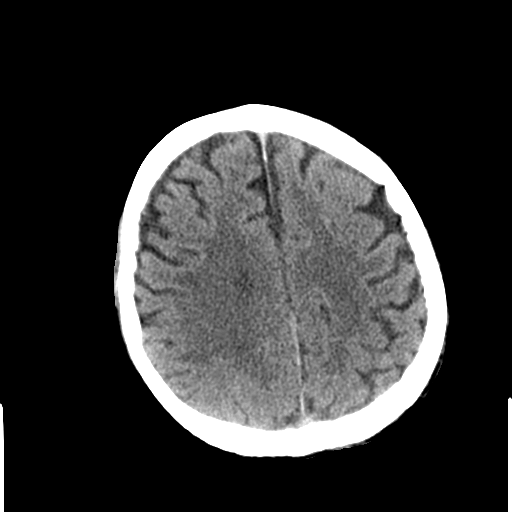
[im 23/32  brain]
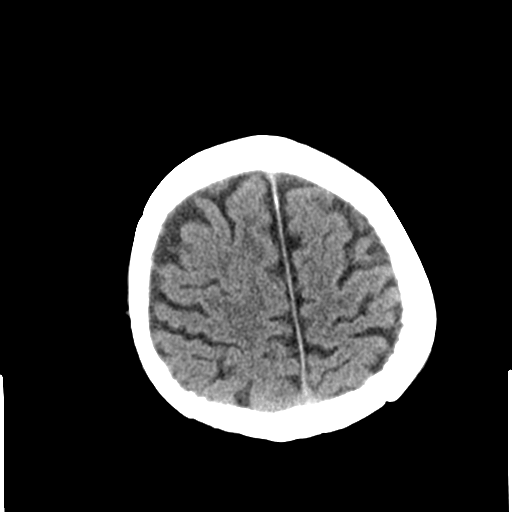
[im 26/32  brain]
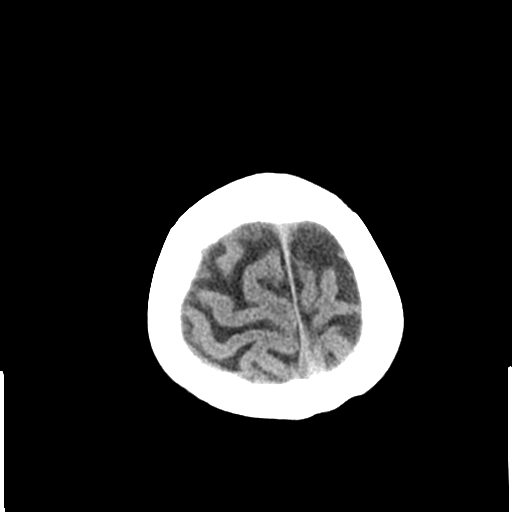
[im 29/32  brain]
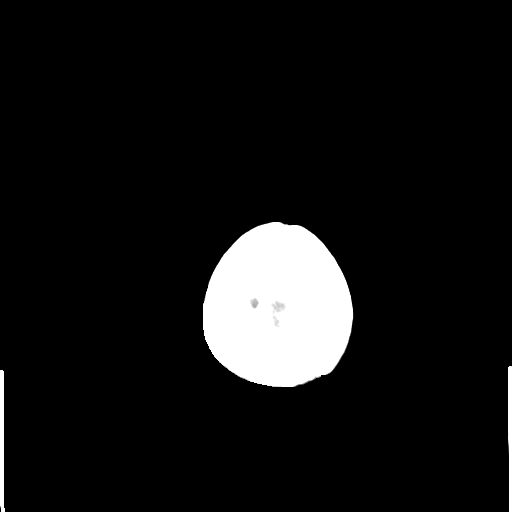
[im 29/32  bone]
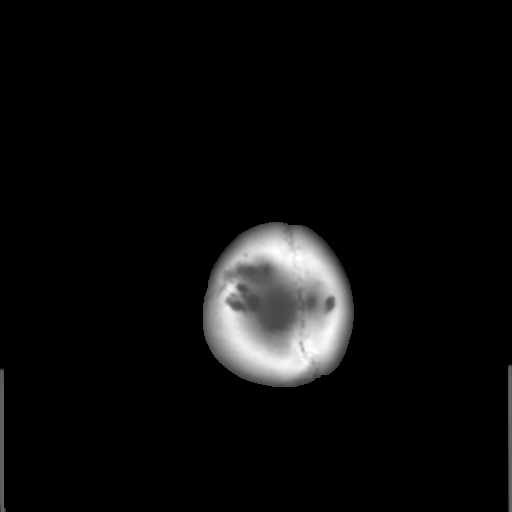

[Series 4: coronal soft · coronal · 0.31mm/px · 3 of 65 slices shown]
[im 22/65  brain]
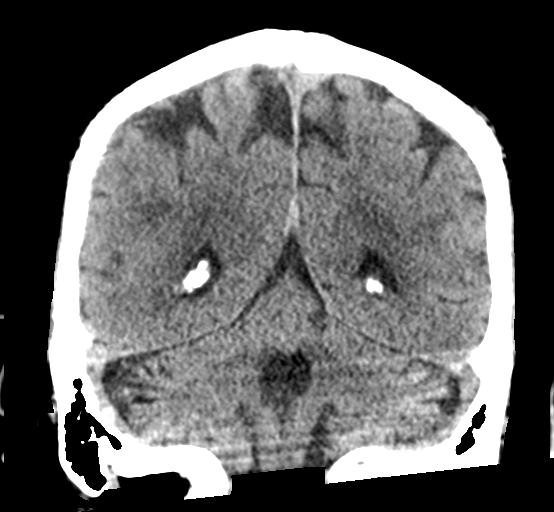
[im 29/65  brain]
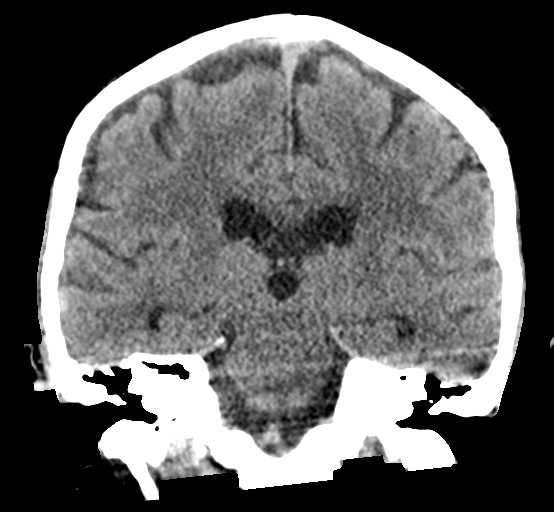
[im 36/65  brain]
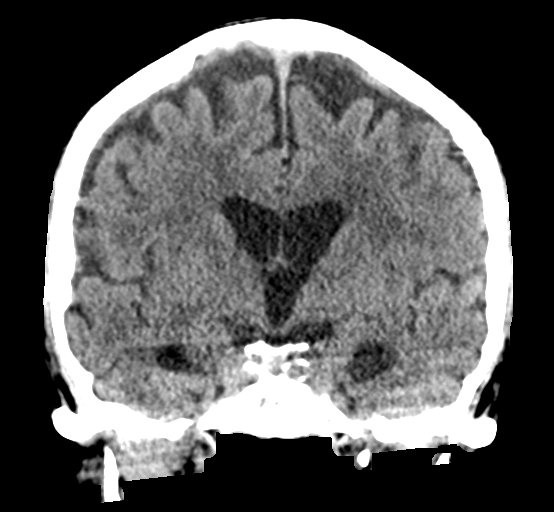

[Series 5: sagittal soft · sagittal · 0.29mm/px · 3 of 57 slices shown]
[im 19/57  brain]
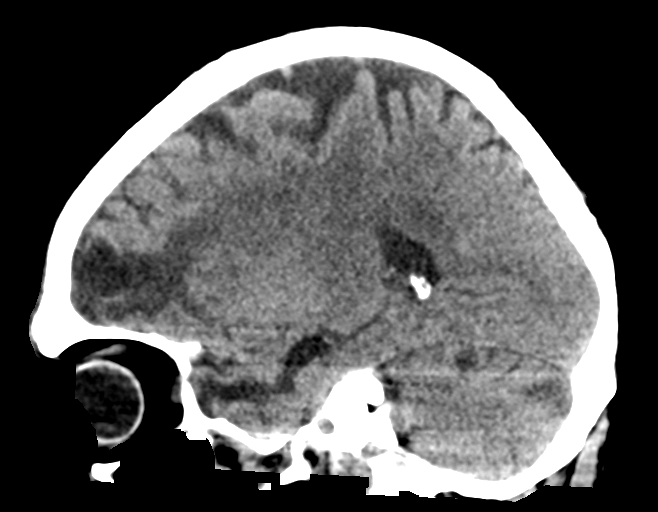
[im 29/57  brain]
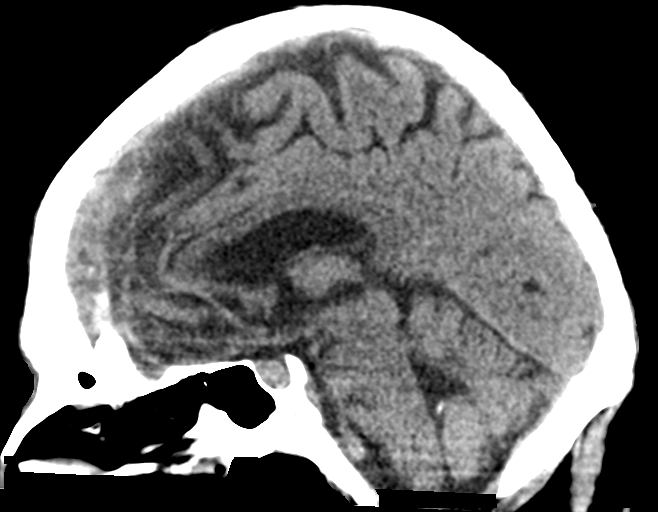
[im 38/57  brain]
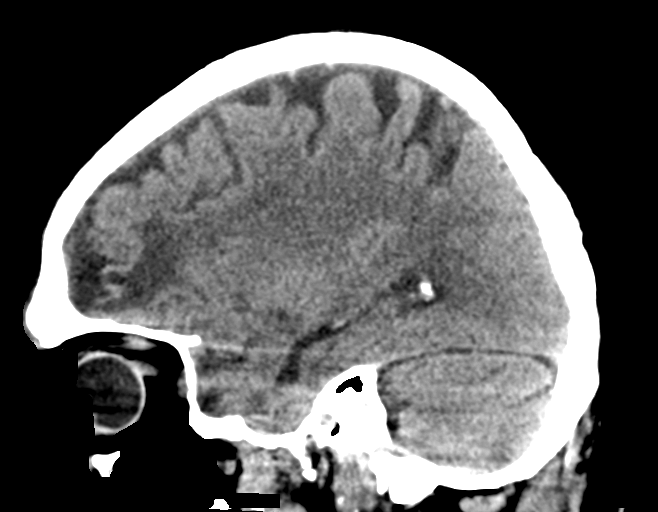

[15 of 47 positions shown; findings below may reference images not displayed]

FINDINGS: Brain: There is mild age-related atrophy and chronic microvascular
ischemic changes. Bifrontal and left cerebellar and left temporal
old infarcts and encephalomalacia. Old left thalamic lacunar
infarct. There is no acute intracranial hemorrhage. No mass effect
or midline shift. No extra-axial fluid collection.

Vascular: No hyperdense vessel or unexpected calcification.

Skull: Normal. Negative for fracture or focal lesion.

Sinuses/Orbits: No acute finding.

Other: None
IMPRESSION: 1. No acute intracranial hemorrhage.
2. Age-related atrophy and chronic microvascular ischemic changes.
Bifrontal, left cerebellar, and left temporal old infarcts and
encephalomalacia. Old left thalamic lacunar infarct.

## 2022-03-23 DIAGNOSIS — M545 Low back pain, unspecified: Secondary | ICD-10-CM | POA: Diagnosis not present

## 2022-03-23 DIAGNOSIS — M25551 Pain in right hip: Secondary | ICD-10-CM | POA: Diagnosis not present

## 2022-03-23 DIAGNOSIS — M25561 Pain in right knee: Secondary | ICD-10-CM | POA: Diagnosis not present

## 2022-04-02 DIAGNOSIS — M1711 Unilateral primary osteoarthritis, right knee: Secondary | ICD-10-CM | POA: Diagnosis not present

## 2022-04-16 DIAGNOSIS — Z23 Encounter for immunization: Secondary | ICD-10-CM | POA: Diagnosis not present

## 2022-05-12 DIAGNOSIS — I10 Essential (primary) hypertension: Secondary | ICD-10-CM | POA: Diagnosis not present

## 2022-05-12 DIAGNOSIS — M25561 Pain in right knee: Secondary | ICD-10-CM | POA: Diagnosis not present

## 2022-05-12 DIAGNOSIS — Z299 Encounter for prophylactic measures, unspecified: Secondary | ICD-10-CM | POA: Diagnosis not present

## 2022-08-31 DIAGNOSIS — I10 Essential (primary) hypertension: Secondary | ICD-10-CM | POA: Diagnosis not present

## 2022-08-31 DIAGNOSIS — Z87891 Personal history of nicotine dependence: Secondary | ICD-10-CM | POA: Diagnosis not present

## 2022-08-31 DIAGNOSIS — Z0000 Encounter for general adult medical examination without abnormal findings: Secondary | ICD-10-CM | POA: Diagnosis not present

## 2022-08-31 DIAGNOSIS — R5383 Other fatigue: Secondary | ICD-10-CM | POA: Diagnosis not present

## 2022-08-31 DIAGNOSIS — Z1331 Encounter for screening for depression: Secondary | ICD-10-CM | POA: Diagnosis not present

## 2022-08-31 DIAGNOSIS — Z1339 Encounter for screening examination for other mental health and behavioral disorders: Secondary | ICD-10-CM | POA: Diagnosis not present

## 2022-08-31 DIAGNOSIS — E7800 Pure hypercholesterolemia, unspecified: Secondary | ICD-10-CM | POA: Diagnosis not present

## 2022-08-31 DIAGNOSIS — Z299 Encounter for prophylactic measures, unspecified: Secondary | ICD-10-CM | POA: Diagnosis not present

## 2022-08-31 DIAGNOSIS — Z7189 Other specified counseling: Secondary | ICD-10-CM | POA: Diagnosis not present

## 2022-09-03 DIAGNOSIS — R5383 Other fatigue: Secondary | ICD-10-CM | POA: Diagnosis not present

## 2022-09-03 DIAGNOSIS — Z79899 Other long term (current) drug therapy: Secondary | ICD-10-CM | POA: Diagnosis not present

## 2022-09-03 DIAGNOSIS — Z125 Encounter for screening for malignant neoplasm of prostate: Secondary | ICD-10-CM | POA: Diagnosis not present

## 2022-09-03 DIAGNOSIS — E7800 Pure hypercholesterolemia, unspecified: Secondary | ICD-10-CM | POA: Diagnosis not present

## 2022-11-01 DIAGNOSIS — X32XXXD Exposure to sunlight, subsequent encounter: Secondary | ICD-10-CM | POA: Diagnosis not present

## 2022-11-01 DIAGNOSIS — L570 Actinic keratosis: Secondary | ICD-10-CM | POA: Diagnosis not present

## 2022-11-25 DIAGNOSIS — I10 Essential (primary) hypertension: Secondary | ICD-10-CM | POA: Diagnosis not present

## 2022-11-25 DIAGNOSIS — R8290 Unspecified abnormal findings in urine: Secondary | ICD-10-CM | POA: Diagnosis not present

## 2022-11-25 DIAGNOSIS — J309 Allergic rhinitis, unspecified: Secondary | ICD-10-CM | POA: Diagnosis not present

## 2022-11-25 DIAGNOSIS — H353220 Exudative age-related macular degeneration, left eye, stage unspecified: Secondary | ICD-10-CM | POA: Diagnosis not present

## 2022-11-25 DIAGNOSIS — M1990 Unspecified osteoarthritis, unspecified site: Secondary | ICD-10-CM | POA: Diagnosis not present

## 2022-11-25 DIAGNOSIS — Z299 Encounter for prophylactic measures, unspecified: Secondary | ICD-10-CM | POA: Diagnosis not present

## 2023-04-12 DIAGNOSIS — Z23 Encounter for immunization: Secondary | ICD-10-CM | POA: Diagnosis not present

## 2023-05-06 DIAGNOSIS — Z23 Encounter for immunization: Secondary | ICD-10-CM | POA: Diagnosis not present

## 2023-05-09 DIAGNOSIS — M4802 Spinal stenosis, cervical region: Secondary | ICD-10-CM | POA: Diagnosis not present

## 2023-05-09 DIAGNOSIS — H9190 Unspecified hearing loss, unspecified ear: Secondary | ICD-10-CM | POA: Diagnosis not present

## 2023-05-09 DIAGNOSIS — Z043 Encounter for examination and observation following other accident: Secondary | ICD-10-CM | POA: Diagnosis not present

## 2023-05-09 DIAGNOSIS — S270XXA Traumatic pneumothorax, initial encounter: Secondary | ICD-10-CM | POA: Diagnosis not present

## 2023-05-09 DIAGNOSIS — S60221A Contusion of right hand, initial encounter: Secondary | ICD-10-CM | POA: Diagnosis not present

## 2023-05-09 DIAGNOSIS — Z8673 Personal history of transient ischemic attack (TIA), and cerebral infarction without residual deficits: Secondary | ICD-10-CM | POA: Diagnosis not present

## 2023-05-09 DIAGNOSIS — S32038A Other fracture of third lumbar vertebra, initial encounter for closed fracture: Secondary | ICD-10-CM | POA: Diagnosis not present

## 2023-05-09 DIAGNOSIS — W19XXXA Unspecified fall, initial encounter: Secondary | ICD-10-CM | POA: Diagnosis not present

## 2023-05-09 DIAGNOSIS — R7989 Other specified abnormal findings of blood chemistry: Secondary | ICD-10-CM | POA: Diagnosis not present

## 2023-05-09 DIAGNOSIS — S60222A Contusion of left hand, initial encounter: Secondary | ICD-10-CM | POA: Diagnosis not present

## 2023-05-09 DIAGNOSIS — I700 Atherosclerosis of aorta: Secondary | ICD-10-CM | POA: Diagnosis not present

## 2023-05-09 DIAGNOSIS — M47812 Spondylosis without myelopathy or radiculopathy, cervical region: Secondary | ICD-10-CM | POA: Diagnosis not present

## 2023-05-09 DIAGNOSIS — S2231XA Fracture of one rib, right side, initial encounter for closed fracture: Secondary | ICD-10-CM | POA: Diagnosis not present

## 2023-05-09 DIAGNOSIS — Z9049 Acquired absence of other specified parts of digestive tract: Secondary | ICD-10-CM | POA: Diagnosis not present

## 2023-05-09 DIAGNOSIS — S32030A Wedge compression fracture of third lumbar vertebra, initial encounter for closed fracture: Secondary | ICD-10-CM | POA: Diagnosis not present

## 2023-05-09 DIAGNOSIS — Z79899 Other long term (current) drug therapy: Secondary | ICD-10-CM | POA: Diagnosis not present

## 2023-05-09 DIAGNOSIS — Z87891 Personal history of nicotine dependence: Secondary | ICD-10-CM | POA: Diagnosis not present

## 2023-05-09 DIAGNOSIS — K219 Gastro-esophageal reflux disease without esophagitis: Secondary | ICD-10-CM | POA: Diagnosis not present

## 2023-05-09 DIAGNOSIS — M545 Low back pain, unspecified: Secondary | ICD-10-CM | POA: Diagnosis not present

## 2023-05-09 DIAGNOSIS — I7090 Unspecified atherosclerosis: Secondary | ICD-10-CM | POA: Diagnosis not present

## 2023-05-09 DIAGNOSIS — R9082 White matter disease, unspecified: Secondary | ICD-10-CM | POA: Diagnosis not present

## 2023-05-09 DIAGNOSIS — S0630AA Unspecified focal traumatic brain injury with loss of consciousness status unknown, initial encounter: Secondary | ICD-10-CM | POA: Diagnosis not present

## 2023-05-09 DIAGNOSIS — J939 Pneumothorax, unspecified: Secondary | ICD-10-CM | POA: Diagnosis not present

## 2023-05-09 DIAGNOSIS — M5030 Other cervical disc degeneration, unspecified cervical region: Secondary | ICD-10-CM | POA: Diagnosis not present

## 2023-05-09 DIAGNOSIS — S01111A Laceration without foreign body of right eyelid and periocular area, initial encounter: Secondary | ICD-10-CM | POA: Diagnosis not present

## 2023-05-10 DIAGNOSIS — J939 Pneumothorax, unspecified: Secondary | ICD-10-CM | POA: Diagnosis not present

## 2023-05-10 DIAGNOSIS — S270XXA Traumatic pneumothorax, initial encounter: Secondary | ICD-10-CM | POA: Diagnosis not present

## 2023-05-10 DIAGNOSIS — H9190 Unspecified hearing loss, unspecified ear: Secondary | ICD-10-CM | POA: Diagnosis not present

## 2023-05-10 DIAGNOSIS — N179 Acute kidney failure, unspecified: Secondary | ICD-10-CM | POA: Diagnosis not present

## 2023-05-10 DIAGNOSIS — M545 Low back pain, unspecified: Secondary | ICD-10-CM | POA: Diagnosis not present

## 2023-05-10 DIAGNOSIS — S0101XA Laceration without foreign body of scalp, initial encounter: Secondary | ICD-10-CM | POA: Diagnosis not present

## 2023-05-10 DIAGNOSIS — S2231XA Fracture of one rib, right side, initial encounter for closed fracture: Secondary | ICD-10-CM | POA: Diagnosis not present

## 2023-05-10 DIAGNOSIS — M546 Pain in thoracic spine: Secondary | ICD-10-CM | POA: Diagnosis not present

## 2023-05-10 DIAGNOSIS — M542 Cervicalgia: Secondary | ICD-10-CM | POA: Diagnosis not present

## 2023-05-10 DIAGNOSIS — S066X0A Traumatic subarachnoid hemorrhage without loss of consciousness, initial encounter: Secondary | ICD-10-CM | POA: Diagnosis not present

## 2023-05-10 DIAGNOSIS — S32039A Unspecified fracture of third lumbar vertebra, initial encounter for closed fracture: Secondary | ICD-10-CM | POA: Diagnosis not present

## 2023-05-18 DIAGNOSIS — S2239XA Fracture of one rib, unspecified side, initial encounter for closed fracture: Secondary | ICD-10-CM | POA: Diagnosis not present

## 2023-05-18 DIAGNOSIS — I10 Essential (primary) hypertension: Secondary | ICD-10-CM | POA: Diagnosis not present

## 2023-05-18 DIAGNOSIS — E876 Hypokalemia: Secondary | ICD-10-CM | POA: Diagnosis not present

## 2023-05-18 DIAGNOSIS — S298XXA Other specified injuries of thorax, initial encounter: Secondary | ICD-10-CM | POA: Diagnosis not present

## 2024-04-20 DIAGNOSIS — Z23 Encounter for immunization: Secondary | ICD-10-CM | POA: Diagnosis not present

## 2024-05-29 DIAGNOSIS — I10 Essential (primary) hypertension: Secondary | ICD-10-CM | POA: Diagnosis not present

## 2024-05-29 DIAGNOSIS — Z125 Encounter for screening for malignant neoplasm of prostate: Secondary | ICD-10-CM | POA: Diagnosis not present

## 2024-05-29 DIAGNOSIS — Z0000 Encounter for general adult medical examination without abnormal findings: Secondary | ICD-10-CM | POA: Diagnosis not present

## 2024-05-29 DIAGNOSIS — Z1331 Encounter for screening for depression: Secondary | ICD-10-CM | POA: Diagnosis not present

## 2024-05-29 DIAGNOSIS — Z79899 Other long term (current) drug therapy: Secondary | ICD-10-CM | POA: Diagnosis not present

## 2024-05-29 DIAGNOSIS — R5383 Other fatigue: Secondary | ICD-10-CM | POA: Diagnosis not present

## 2024-05-29 DIAGNOSIS — R52 Pain, unspecified: Secondary | ICD-10-CM | POA: Diagnosis not present

## 2024-05-29 DIAGNOSIS — Z299 Encounter for prophylactic measures, unspecified: Secondary | ICD-10-CM | POA: Diagnosis not present

## 2024-05-29 DIAGNOSIS — Z7189 Other specified counseling: Secondary | ICD-10-CM | POA: Diagnosis not present

## 2024-05-29 DIAGNOSIS — Z1339 Encounter for screening examination for other mental health and behavioral disorders: Secondary | ICD-10-CM | POA: Diagnosis not present

## 2024-05-29 DIAGNOSIS — E7800 Pure hypercholesterolemia, unspecified: Secondary | ICD-10-CM | POA: Diagnosis not present
